# Patient Record
Sex: Female | Born: 1970
Health system: Southern US, Community
[De-identification: ages and names within clinical notes are randomized; demographics above are authoritative.]

## PROBLEM LIST (undated history)

## (undated) DIAGNOSIS — N189 Chronic kidney disease, unspecified: Secondary | ICD-10-CM

---

## 2004-01-13 ENCOUNTER — Emergency Department (HOSPITAL_COMMUNITY): Admission: EM | Admit: 2004-01-13 | Discharge: 2004-01-13 | Payer: Self-pay | Admitting: *Deleted

## 2004-02-12 ENCOUNTER — Emergency Department (HOSPITAL_COMMUNITY): Admission: EM | Admit: 2004-02-12 | Discharge: 2004-02-12 | Payer: Self-pay | Admitting: Emergency Medicine

## 2005-01-27 ENCOUNTER — Ambulatory Visit: Payer: Self-pay | Admitting: Family Medicine

## 2005-01-28 ENCOUNTER — Ambulatory Visit: Payer: Self-pay | Admitting: Family Medicine

## 2005-04-22 ENCOUNTER — Ambulatory Visit: Payer: Self-pay | Admitting: Family Medicine

## 2005-07-14 ENCOUNTER — Ambulatory Visit: Payer: Self-pay | Admitting: Family Medicine

## 2005-10-05 ENCOUNTER — Ambulatory Visit: Payer: Self-pay | Admitting: Family Medicine

## 2007-10-15 ENCOUNTER — Inpatient Hospital Stay (HOSPITAL_COMMUNITY): Admission: AD | Admit: 2007-10-15 | Discharge: 2007-10-15 | Payer: Self-pay | Admitting: Obstetrics & Gynecology

## 2007-10-23 ENCOUNTER — Inpatient Hospital Stay (HOSPITAL_COMMUNITY): Admission: AD | Admit: 2007-10-23 | Discharge: 2007-10-23 | Payer: Self-pay | Admitting: Obstetrics & Gynecology

## 2008-06-09 ENCOUNTER — Ambulatory Visit: Payer: Self-pay | Admitting: Gynecology

## 2008-06-09 ENCOUNTER — Inpatient Hospital Stay (HOSPITAL_COMMUNITY): Admission: AD | Admit: 2008-06-09 | Discharge: 2008-06-09 | Payer: Self-pay | Admitting: Gynecology

## 2008-06-09 ENCOUNTER — Inpatient Hospital Stay (HOSPITAL_COMMUNITY): Admission: AD | Admit: 2008-06-09 | Discharge: 2008-06-09 | Payer: Self-pay | Admitting: Family Medicine

## 2008-06-12 ENCOUNTER — Inpatient Hospital Stay (HOSPITAL_COMMUNITY): Admission: AD | Admit: 2008-06-12 | Discharge: 2008-06-12 | Payer: Self-pay | Admitting: Family Medicine

## 2008-09-02 ENCOUNTER — Inpatient Hospital Stay (HOSPITAL_COMMUNITY): Admission: AD | Admit: 2008-09-02 | Discharge: 2008-09-02 | Payer: Self-pay | Admitting: Obstetrics & Gynecology

## 2008-11-19 ENCOUNTER — Inpatient Hospital Stay (HOSPITAL_COMMUNITY): Admission: AD | Admit: 2008-11-19 | Discharge: 2008-11-19 | Payer: Self-pay | Admitting: Obstetrics & Gynecology

## 2009-03-18 ENCOUNTER — Ambulatory Visit: Payer: Self-pay | Admitting: Physician Assistant

## 2009-03-18 ENCOUNTER — Inpatient Hospital Stay (HOSPITAL_COMMUNITY): Admission: AD | Admit: 2009-03-18 | Discharge: 2009-03-18 | Payer: Self-pay | Admitting: Obstetrics & Gynecology

## 2010-01-29 ENCOUNTER — Emergency Department (HOSPITAL_COMMUNITY): Admission: EM | Admit: 2010-01-29 | Discharge: 2010-01-29 | Payer: Self-pay | Admitting: Emergency Medicine

## 2011-02-23 LAB — GC/CHLAMYDIA PROBE AMP, GENITAL
Chlamydia, DNA Probe: NEGATIVE
GC Probe Amp, Genital: NEGATIVE

## 2011-02-23 LAB — URINE MICROSCOPIC-ADD ON

## 2011-02-23 LAB — URINALYSIS, ROUTINE W REFLEX MICROSCOPIC
Bilirubin Urine: NEGATIVE
Ketones, ur: NEGATIVE mg/dL
Protein, ur: NEGATIVE mg/dL
pH: 6 (ref 5.0–8.0)

## 2011-02-23 LAB — CBC
MCHC: 34.5 g/dL (ref 30.0–36.0)
MCV: 90.9 fL (ref 78.0–100.0)
Platelets: 261 10*3/uL (ref 150–400)
RBC: 4.58 MIL/uL (ref 3.87–5.11)
RDW: 13.1 % (ref 11.5–15.5)

## 2011-02-23 LAB — HCG, QUANTITATIVE, PREGNANCY: hCG, Beta Chain, Quant, S: 29570 m[IU]/mL — ABNORMAL HIGH (ref <5)

## 2011-03-01 LAB — GC/CHLAMYDIA PROBE AMP, GENITAL: GC Probe Amp, Genital: NEGATIVE

## 2011-03-01 LAB — URINE CULTURE: Colony Count: >=100000

## 2011-03-01 LAB — URINALYSIS, ROUTINE W REFLEX MICROSCOPIC
Bilirubin Urine: NEGATIVE
Protein, ur: NEGATIVE mg/dL
Urobilinogen, UA: 0.2 mg/dL (ref 0.0–1.0)

## 2011-03-01 LAB — URINE MICROSCOPIC-ADD ON

## 2011-03-01 LAB — WET PREP, GENITAL: Yeast Wet Prep HPF POC: NONE SEEN

## 2011-03-01 LAB — ABO/RH: ABO/RH(D): A POS

## 2011-08-13 LAB — URINALYSIS, ROUTINE W REFLEX MICROSCOPIC
Bilirubin Urine: NEGATIVE
Glucose, UA: NEGATIVE
Glucose, UA: NEGATIVE
Ketones, ur: NEGATIVE
Nitrite: NEGATIVE
Nitrite: NEGATIVE
Protein, ur: NEGATIVE
Specific Gravity, Urine: <1.005 — ABNORMAL LOW
Urobilinogen, UA: 1
Urobilinogen, UA: 0.2

## 2011-08-13 LAB — CBC
Hemoglobin: 13.6
Hemoglobin: 13.6
MCHC: 34.1
MCV: 88.7
RBC: 4.48
RBC: 4.51
RDW: 12
WBC: 8.1

## 2011-08-13 LAB — URINE MICROSCOPIC-ADD ON: RBC / HPF: NONE SEEN

## 2011-08-13 LAB — DIFFERENTIAL
Lymphs Abs: 1.6
Monocytes Absolute: 0.9
Monocytes Relative: 11
Neutro Abs: 5.6
Neutrophils Relative %: 69

## 2011-08-13 LAB — WET PREP, GENITAL
Clue Cells Wet Prep HPF POC: NONE SEEN
Trich, Wet Prep: NONE SEEN

## 2011-08-13 LAB — URINE CULTURE

## 2011-08-13 LAB — GC/CHLAMYDIA PROBE AMP, GENITAL: GC Probe Amp, Genital: NEGATIVE

## 2011-08-13 LAB — POCT PREGNANCY, URINE: Preg Test, Ur: NEGATIVE

## 2011-08-17 LAB — GC/CHLAMYDIA PROBE AMP, GENITAL
Chlamydia, DNA Probe: POSITIVE — AB
GC Probe Amp, Genital: NEGATIVE

## 2011-08-17 LAB — WET PREP, GENITAL: Yeast Wet Prep HPF POC: NONE SEEN

## 2011-08-17 LAB — URINALYSIS, ROUTINE W REFLEX MICROSCOPIC
Bilirubin Urine: NEGATIVE
Glucose, UA: NEGATIVE
Protein, ur: NEGATIVE

## 2011-08-17 LAB — URINE CULTURE

## 2011-08-17 LAB — URINE MICROSCOPIC-ADD ON

## 2011-08-17 LAB — POCT PREGNANCY, URINE: Preg Test, Ur: NEGATIVE

## 2011-08-24 LAB — URINALYSIS, ROUTINE W REFLEX MICROSCOPIC
Protein, ur: 30 — AB
Urobilinogen, UA: 0.2

## 2011-08-24 LAB — CBC
MCHC: 35.2
RBC: 4.44
RDW: 12.3

## 2011-08-24 LAB — HCG, QUANTITATIVE, PREGNANCY: hCG, Beta Chain, Quant, S: 8 — ABNORMAL HIGH

## 2011-08-24 LAB — HCG, SERUM, QUALITATIVE: Preg, Serum: NEGATIVE

## 2012-02-28 ENCOUNTER — Inpatient Hospital Stay (HOSPITAL_COMMUNITY)
Admission: AD | Admit: 2012-02-28 | Discharge: 2012-02-28 | Disposition: A | Discharge status: Hospice | Payer: Self-pay | Attending: Obstetrics & Gynecology | Admitting: Obstetrics & Gynecology

## 2012-02-28 ENCOUNTER — Inpatient Hospital Stay (HOSPITAL_COMMUNITY): Payer: Self-pay

## 2012-02-28 ENCOUNTER — Encounter (HOSPITAL_COMMUNITY): Payer: Self-pay | Admitting: *Deleted

## 2012-02-28 DIAGNOSIS — R1032 Left lower quadrant pain: Secondary | ICD-10-CM | POA: Insufficient documentation

## 2012-02-28 DIAGNOSIS — N390 Urinary tract infection, site not specified: Secondary | ICD-10-CM | POA: Insufficient documentation

## 2012-02-28 DIAGNOSIS — B349 Viral infection, unspecified: Secondary | ICD-10-CM

## 2012-02-28 DIAGNOSIS — B9789 Other viral agents as the cause of diseases classified elsewhere: Secondary | ICD-10-CM | POA: Insufficient documentation

## 2012-02-28 HISTORY — DX: Chronic kidney disease, unspecified: N18.9

## 2012-02-28 LAB — URINALYSIS, ROUTINE W REFLEX MICROSCOPIC
Glucose, UA: NEGATIVE mg/dL
pH: 5.5 (ref 5.0–8.0)

## 2012-02-28 LAB — URINE MICROSCOPIC-ADD ON

## 2012-02-28 LAB — CBC
HCT: 40.3 % (ref 36.0–46.0)
Hemoglobin: 13.8 g/dL (ref 12.0–15.0)
RDW: 12.6 % (ref 11.5–15.5)
WBC: 14.3 10*3/uL — ABNORMAL HIGH (ref 4.0–10.5)

## 2012-02-28 LAB — WET PREP, GENITAL
Clue Cells Wet Prep HPF POC: NONE SEEN
Trich, Wet Prep: NONE SEEN

## 2012-02-28 MED ORDER — CEPHALEXIN 500 MG PO CAPS: 500.0000 mg | ORAL_CAPSULE | Freq: Four times a day (QID) | ORAL | Status: AC

## 2012-02-28 MED ORDER — ACETAMINOPHEN 500 MG PO TABS
1000.0000 mg | ORAL_TABLET | Freq: Once | ORAL | Status: AC
  Administered 2012-02-28: 1000 mg via ORAL
  Filled 2012-02-28: qty 2

## 2012-02-28 NOTE — MAU Note (Signed)
Pt c/o pain and burning from knees to feet for the past 7 months.  Was given pain meds for a few days, 7 months ago that helped with the pain.  Friday started with a fever and nausea.

## 2012-02-28 NOTE — MAU Provider Note (Signed)
History   Pt presents today c/o fever and chills since yesterday. She also c/o some mild dysuria. She has also c/o LLQ pain for the past 3 months that is worse with intercourse. She denies vag dc, bleeding, vag irritation. Her last episode of intercourse was about 2 months ago. She also c/o bilateral leg pain that starts below her knees and goes into her feet. She was given naproxen for the leg pain 7 months ago but the pain continues to come and go.   CSN: [id number]  Arrival date and time: 02/28/12 0021   First Provider Initiated Contact with Patient 02/28/12 0053      No chief complaint on file.  HPI  OB History    Grav Para Term Preterm Abortions TAB SAB Ect Mult Living   7 6 6  1  1   5       Past Medical History  Diagnosis Date  . Chronic kidney disease     History reviewed. No pertinent past surgical history.  History reviewed. No pertinent family history.  History  Substance Use Topics  . Smoking status: Not on file  . Smokeless tobacco: Not on file  . Alcohol Use: No    Allergies: Allergies not on file  No prescriptions prior to admission    Review of Systems  Constitutional: Positive for fever and chills. Negative for weight loss, malaise/fatigue and diaphoresis.  Eyes: Negative for blurred vision and double vision.  Respiratory: Negative for cough, hemoptysis, sputum production, shortness of breath and wheezing.   Cardiovascular: Negative for chest pain and palpitations.  Gastrointestinal: Positive for abdominal pain. Negative for nausea, vomiting, diarrhea and constipation.  Genitourinary: Positive for dysuria. Negative for urgency, frequency and hematuria.  Neurological: Negative for dizziness, weakness and headaches.  Psychiatric/Behavioral: Negative for depression and suicidal ideas.   Physical Exam   Blood pressure 95/63, pulse 148, temperature 102.9 F (39.4 C), temperature source Oral, resp. rate 20, weight 170 lb (77.111 kg), SpO2  96.00%.  Physical Exam  Nursing note and vitals reviewed. Constitutional: She is oriented to person, place, and time. She appears well-developed and well-nourished. No distress.  HENT:  Head: Normocephalic and atraumatic.  Eyes: EOM are normal. Pupils are equal, round, and reactive to light.  Cardiovascular: Regular rhythm and normal heart sounds.  Tachycardia present.  Exam reveals no gallop and no friction rub.   No murmur heard. Respiratory: Effort normal and breath sounds normal. No respiratory distress. She has no wheezes. She has no rales. She exhibits no tenderness.  GI: Soft. She exhibits no distension and no mass. There is tenderness (Mild tenderness to LLQ with palpation.). There is no rebound and no guarding.  Genitourinary: No bleeding around the vagina. Vaginal discharge found.       Copious amount of white, creamy vag dc. Cervix Lg/closed. Uterus NL size and shape. Tenderness to left adnexa. No obvious adnexal masses.  Neurological: She is alert and oriented to person, place, and time.  Skin: Skin is warm and dry. She is not diaphoretic.  Psychiatric: She has a normal mood and affect. Her behavior is normal. Judgment and thought content normal.    MAU Course  Procedures  Wet prep and GC/Chlamydia cultures done.  Results for orders placed during the hospital encounter of 02/28/12 (from the past 24 hour(s))  URINALYSIS, ROUTINE W REFLEX MICROSCOPIC     Status: Abnormal   Collection Time   02/28/12  1:00 AM      Component Value Range  Color, Urine YELLOW  YELLOW    APPearance CLEAR  CLEAR    Specific Gravity, Urine 1.020  1.005 - 1.030    pH 5.5  5.0 - 8.0    Glucose, UA NEGATIVE  NEGATIVE (mg/dL)   Hgb urine dipstick SMALL (*) NEGATIVE    Bilirubin Urine NEGATIVE  NEGATIVE    Ketones, ur NEGATIVE  NEGATIVE (mg/dL)   Protein, ur 30 (*) NEGATIVE (mg/dL)   Urobilinogen, UA 1.0  0.0 - 1.0 (mg/dL)   Nitrite POSITIVE (*) NEGATIVE    Leukocytes, UA MODERATE (*) NEGATIVE    WET PREP, GENITAL     Status: Abnormal   Collection Time   02/28/12  1:00 AM      Component Value Range   Yeast Wet Prep HPF POC NONE SEEN  NONE SEEN    Trich, Wet Prep NONE SEEN  NONE SEEN    Clue Cells Wet Prep HPF POC NONE SEEN  NONE SEEN    WBC, Wet Prep HPF POC MODERATE (*) NONE SEEN   URINE MICROSCOPIC-ADD ON     Status: Abnormal   Collection Time   02/28/12  1:00 AM      Component Value Range   Squamous Epithelial / LPF FEW (*) RARE    WBC, UA 21-50  <3 (WBC/hpf)   RBC / HPF 0-2  <3 (RBC/hpf)   Bacteria, UA MANY (*) RARE    Urine-Other MUCOUS PRESENT    CBC     Status: Abnormal   Collection Time   02/28/12  1:15 AM      Component Value Range   WBC 14.3 (*) 4.0 - 10.5 (K/uL)   RBC 4.67  3.87 - 5.11 (MIL/uL)   Hemoglobin 13.8  12.0 - 15.0 (g/dL)   HCT 04.5  40.9 - 81.1 (%)   MCV 86.3  78.0 - 100.0 (fL)   MCH 29.6  26.0 - 34.0 (pg)   MCHC 34.2  30.0 - 36.0 (g/dL)   RDW 91.4  78.2 - 95.6 (%)   Platelets 202  150 - 400 (K/uL)   Urine sent for culture.  Pt temp and pulse now WNL following po tylenol.  Assessment and Plan  UTI: discussed with pt at length via interpreter. Will tx with Keflex and await urine culture.  Viral syndrome: pt will use supportive care with tylenol for pain and fever. She will f/u with her PCP. Discussed diet, activity, risks, and precautions.  Clinton Gallant. Lovella Hardie III, DrHSc, MPAS, PA-C  02/28/2012, 1:05 AM

## 2012-02-29 LAB — GC/CHLAMYDIA PROBE AMP, GENITAL: Chlamydia, DNA Probe: NEGATIVE

## 2012-03-10 NOTE — MAU Provider Note (Signed)
Medical Screening exam and patient care preformed by advanced practice provider.  Agree with the above management.  

## 2014-04-03 ENCOUNTER — Ambulatory Visit: Payer: Self-pay | Attending: Internal Medicine

## 2014-09-16 ENCOUNTER — Encounter (HOSPITAL_COMMUNITY): Payer: Self-pay | Admitting: *Deleted

## 2018-05-16 ENCOUNTER — Ambulatory Visit: Payer: Self-pay | Attending: Internal Medicine | Admitting: Internal Medicine

## 2018-05-16 ENCOUNTER — Encounter: Payer: Self-pay | Admitting: Internal Medicine

## 2018-05-16 VITALS — BP 110/80 | HR 83 | Temp 98.0°F | Ht 62.0 in | Wt 173.0 lb

## 2018-05-16 DIAGNOSIS — R110 Nausea: Secondary | ICD-10-CM | POA: Insufficient documentation

## 2018-05-16 DIAGNOSIS — R079 Chest pain, unspecified: Secondary | ICD-10-CM | POA: Insufficient documentation

## 2018-05-16 DIAGNOSIS — K219 Gastro-esophageal reflux disease without esophagitis: Secondary | ICD-10-CM | POA: Insufficient documentation

## 2018-05-16 DIAGNOSIS — R0602 Shortness of breath: Secondary | ICD-10-CM | POA: Insufficient documentation

## 2018-05-16 DIAGNOSIS — M170 Bilateral primary osteoarthritis of knee: Secondary | ICD-10-CM | POA: Insufficient documentation

## 2018-05-16 MED ORDER — MELOXICAM 7.5 MG PO TABS: 15.0000 mg | ORAL_TABLET | Freq: Every day | ORAL | 2 refills | Status: DC

## 2018-05-16 MED ORDER — OMEPRAZOLE 20 MG PO CPDR: 20.0000 mg | DELAYED_RELEASE_CAPSULE | Freq: Every day | ORAL | 3 refills | Status: DC

## 2018-05-16 MED FILL — OMEPRAZOLE 20 MG CAP: 20 | 30 days supply | Qty: 30 | Fill #0

## 2018-05-16 MED FILL — MELOXICAM 7.5 MG TABLET: 7.5 | 15 days supply | Qty: 30 | Fill #0

## 2018-05-16 NOTE — Patient Instructions (Signed)
Enfermedad por reflujo gastroesofgico en los adultos (Gastroesophageal Reflux Disease, Adult) Normalmente, los alimentos descienden por el esfago y se depositan en el estmago para su digestin. Si una persona tiene enfermedad por reflujo gastroesofgico (ERGE), los alimentos y el cido estomacal regresan al esfago. Cuando esto ocurre, el esfago se irrita y se hincha (inflama). Con el tiempo, la ERGE puede provocar la formacin de pequeas perforaciones (lceras) en la mucosa del esfago. CUIDADOS EN EL HOGAR Dieta  Siga la dieta como se lo haya indicado el mdico. Tal vez deba evitar los siguientes alimentos y bebidas: ? Caf y t (con o sin cafena). ? Bebidas que contengan alcohol. ? Bebidas energizantes y deportivas. ? Gaseosas o refrescos. ? Chocolate y cacao. ? Menta y esencias de menta. ? Ajo y cebollas. ? Rbano picante. ? Alimentos muy condimentados y cidos, como pimientos, chile en polvo, curry en polvo, vinagre, salsas picantes y salsa barbacoa. ? Frutas ctricas y sus jugos, como naranjas, limones y limas. ? Alimentos a base de tomates, como salsa roja, chile, salsa y pizza con salsa roja. ? Alimentos fritos y grasos, como rosquillas, papas fritas y aderezos con alto contenido de grasa. ? Carnes con alto contenido de grasa, como hot dogs, filetes de entrecot, salchicha, jamn y tocino. ? Productos lcteos con alto contenido de grasa, como leche entera, mantequilla y queso crema.  Consuma pequeas porciones de comida con ms frecuencia. Evite consumir porciones abundantes.  Evite beber mucho lquido con las comidas.  No coma durante las 2 o 3horas previas a la hora de acostarse.  No se acueste inmediatamente despus de comer.  No haga actividad fsica enseguida despus de comer. Instrucciones generales  Est atento a cualquier cambio en los sntomas.  Tome los medicamentos de venta libre y los recetados solamente como se lo haya indicado el mdico. No tome  aspirina, ibuprofeno ni otros antiinflamatorios no esteroides (AINE), a menos que el mdico lo autorice.  No consuma ningn producto que contenga tabaco, lo que incluye cigarrillos, tabaco de mascar y cigarrillos electrnicos. Si necesita ayuda para dejar de fumar, consulte al mdico.  Use ropa suelta. No use nada ajustado alrededor de la cintura.  Levante (eleve) unas 6pulgadas (15centmetros) la cabecera de la cama.  Intente bajar el nivel de estrs. Si necesita ayuda para hacerlo, consulte al mdico.  Si tiene sobrepeso, adelgace hasta alcanzar un peso saludable. Pregntele a su mdico cmo puede perder peso de manera segura.  Concurra a todas las visitas de control como se lo haya indicado el mdico. Esto es importante. SOLICITE AYUDA SI:  Aparecen nuevos sntomas.  Baja de peso y no sabe por qu.  Tiene dificultad para tragar o siente dolor al hacerlo.  Tiene sibilancias o tos que no desaparece.  Los sntomas no mejoran con el tratamiento.  Tiene la voz ronca. SOLICITE AYUDA DE INMEDIATO SI:  Tiene dolor en los brazos, el cuello, los maxilares, la dentadura o la espalda.  Transpira, se marea o tiene sensacin de desvanecimiento.  Siente falta de aire o dolor en el pecho.  Vomita y el vmito es parecido a la sangre o a los granos de caf.  Pierde el conocimiento (se desmaya).  Las heces son sanguinolentas o de color negro.  No puede tragar, beber o comer. Esta informacin no tiene como fin reemplazar el consejo del mdico. Asegrese de hacerle al mdico cualquier pregunta que tenga. Document Released: 12/04/2010 Document Revised: 07/23/2015 Document Reviewed: 02/26/2015 Elsevier Interactive Patient Education  2018 Elsevier Inc.  

## 2018-05-16 NOTE — Progress Notes (Signed)
Patient ID: Angela Khan, female    DOB: 1971-08-11  MRN: [id number]  CC: Chest Pain and Shortness of Breath   Subjective: Angela Khan is a 47 y.o. female who presents for new pt visit.  Interpreter from [employer], Stark Kleinony Bastias, is with her. Her concerns today include:   Pt c/o intermittent episodes of difficulty breathing when she eats and nausea after meal x 8 mths.  Describes as hurting in lower chest and  epigastric area.  Feels better when she lays down.  Denies any globus sensation or feeling of food getting stuck in the chest.  She is not had any weight changes.  Symptoms occur with any and all foods.  C/o intermittent pain BL knees x 4 yrs Worse when climbing stairs Better with walking on level ground No stiffness; some swelling T[place] pain med from [place] as needed     Current Outpatient Medications on File Prior to Visit  Medication Sig Dispense Refill  . OVER THE COUNTER MEDICATION Take 1 tablet by mouth as needed. Over the counter medicine called XL-3 which is a cough/cold medication     No current facility-administered medications on file prior to visit.     No Known Allergies  Social History   Socioeconomic History  . Marital status: Single    Spouse name: Not on file  . Number of children: Not on file  . Years of education: Not on file  . Highest education level: Not on file  Occupational History  . Not on file  Social Needs  . Financial resource strain: Not on file  . Food insecurity:    Worry: Not on file    Inability: Not on file  . Transportation needs:    Medical: Not on file    Non-medical: Not on file  Tobacco Use  . Smoking status: Not on file  Substance and Sexual Activity  . Alcohol use: No  . Drug use: Not on file  . Sexual activity: Yes    Birth control/protection: Injection    Comment: depo provera, last injection Mar 22nd 2013  Lifestyle  . Physical activity:    Days per week: Not on file    Minutes per session: Not  on file  . Stress: Not on file  Relationships  . Social connections:    Talks on phone: Not on file    Gets together: Not on file    Attends religious service: Not on file    Active member of club or organization: Not on file    Attends meetings of clubs or organizations: Not on file    Relationship status: Not on file  . Intimate partner violence:    Fear of current or ex partner: Not on file    Emotionally abused: Not on file    Physically abused: Not on file    Forced sexual activity: Not on file  Other Topics Concern  . Not on file  Social History Narrative  . Not on file    No family history on file.  No past surgical history on file.  ROS: Review of Systems Negative except as stated above PHYSICAL EXAM: BP 110/80   Pulse 83   Temp 98 F (36.7 C) (Oral)   Ht 5\' 2"  (1.575 m)   Wt 173 lb (78.5 kg)   SpO2 98%   BMI 31.64 kg/m   Physical Exam  General appearance - alert, well appearing, and in no distress Mental status - normal mood, behavior,  speech, dress, motor activity, and thought processes Neck - supple, no significant adenopathy Chest - clear to auscultation, no wheezes, rales or rhonchi, symmetric air entry Heart - normal rate, regular rhythm, normal S1, S2, no murmurs, rubs, clicks or gallops Abdomen - soft, nontender, nondistended, no masses or organomegaly Musculoskeletal -knees: Mild enlargement of both joints.  Right knee: Mild tenderness on palpation along the medial and lateral joint lines.  No edema.  Moderate crepitus laterally with passive range of movement.  Left knee: No point tenderness.  Mild crepitus with passive range of motion Extremities - peripheral pulses normal, no pedal edema, no clubbing or cyanosis ASSESSMENT AND PLAN: 1. Gastroesophageal reflux disease without esophagitis I think her symptoms are likely due to GERD.  She is agreeable for trial of omeprazole.  GERD precautions discussed. - omeprazole (PRILOSEC) 20 MG capsule; Take 1  capsule (20 mg total) by mouth daily.  Dispense: 30 capsule; Refill: 3  2. Primary osteoarthritis of both knees Encourage regular exercise like brisk walking 3 to 4 days a week. - meloxicam (MOBIC) 7.5 MG tablet; Take 2 tablets (15 mg total) by mouth daily.  Dispense: 30 tablet; Refill: 2   Patient was given the opportunity to ask questions.  Patient verbalized understanding of the plan and was able to repeat key elements of the plan.   No orders of the defined types were placed in this encounter.    Requested Prescriptions   Signed Prescriptions Disp Refills  . meloxicam (MOBIC) 7.5 MG tablet 30 tablet 2    Sig: Take 2 tablets (15 mg total) by mouth daily.  Marland Kitchen omeprazole (PRILOSEC) 20 MG capsule 30 capsule 3    Sig: Take 1 capsule (20 mg total) by mouth daily.    Return in about 7 weeks (around 07/04/2018).  Jonah Blue, MD, FACP

## 2018-05-26 ENCOUNTER — Ambulatory Visit: Payer: Self-pay | Attending: Internal Medicine

## 2018-07-07 ENCOUNTER — Ambulatory Visit: Payer: Self-pay | Admitting: Internal Medicine

## 2018-09-08 MED FILL — OMEPRAZOLE 20 MG CAP: 20 | 30 days supply | Qty: 30 | Fill #1

## 2018-09-08 MED FILL — MELOXICAM 7.5 MG TABLET: 7.5 | 15 days supply | Qty: 30 | Fill #1

## 2019-01-20 DIAGNOSIS — R05 Cough: Secondary | ICD-10-CM | POA: Insufficient documentation

## 2019-01-20 DIAGNOSIS — Z209 Contact with and (suspected) exposure to unspecified communicable disease: Secondary | ICD-10-CM | POA: Insufficient documentation

## 2019-01-20 DIAGNOSIS — N189 Chronic kidney disease, unspecified: Secondary | ICD-10-CM | POA: Insufficient documentation

## 2019-01-20 DIAGNOSIS — J029 Acute pharyngitis, unspecified: Secondary | ICD-10-CM | POA: Insufficient documentation

## 2019-01-20 DIAGNOSIS — Z79899 Other long term (current) drug therapy: Secondary | ICD-10-CM | POA: Insufficient documentation

## 2019-01-20 DIAGNOSIS — J3489 Other specified disorders of nose and nasal sinuses: Secondary | ICD-10-CM | POA: Insufficient documentation

## 2019-01-21 ENCOUNTER — Encounter (HOSPITAL_COMMUNITY): Payer: Self-pay

## 2019-01-21 ENCOUNTER — Emergency Department (HOSPITAL_COMMUNITY)
Admission: EM | Admit: 2019-01-21 | Discharge: 2019-01-21 | Disposition: A | Discharge status: Home / Self Care | Payer: Self-pay | Attending: Emergency Medicine | Admitting: Emergency Medicine

## 2019-01-21 DIAGNOSIS — J029 Acute pharyngitis, unspecified: Secondary | ICD-10-CM

## 2019-01-21 DIAGNOSIS — R059 Cough, unspecified: Secondary | ICD-10-CM

## 2019-01-21 DIAGNOSIS — R05 Cough: Secondary | ICD-10-CM

## 2019-01-21 LAB — GROUP A STREP BY PCR: GROUP A STREP BY PCR: NOT DETECTED

## 2019-01-21 MED ORDER — LIDOCAINE VISCOUS HCL 2 % MT SOLN: 15.0000 mL | Freq: Four times a day (QID) | OROMUCOSAL | 0 refills | Status: DC | PRN

## 2019-01-21 MED ORDER — SUCRALFATE 1 G PO TABS: 1.0000 g | ORAL_TABLET | Freq: Four times a day (QID) | ORAL | 0 refills | Status: DC | PRN

## 2019-01-21 NOTE — Discharge Instructions (Signed)
It was my pleasure taking care of you today!   Lidocaine solution and carafate as needed for sore throat.   Call the primary care doctor listed to schedule a follow up appointment. Call them in the morning.   Return to ER for difficulty breathing, if you cannot swallow, you develop a fever, new or worsening symptoms occur or you have any additional concerns.

## 2019-01-21 NOTE — ED Notes (Signed)
Called for room, no answer X2 

## 2019-01-21 NOTE — ED Triage Notes (Signed)
Pt states that she has had a sore throat for several days.

## 2019-01-21 NOTE — ED Provider Notes (Signed)
[facility] EMERGENCY DEPARTMENT Provider Note   CSN: [id number] Arrival date & time: 01/20/19  2356    History   Chief Complaint Chief Complaint  Patient presents with  . Sore Throat    HPI Angela Khan is a 48 y.o. female.     The history is provided by the patient and medical records. A language interpreter was used Multimedia programmer - Spanish).  Sore Throat  Pertinent negatives include no chest pain, no abdominal pain and no headaches.   Angela Khan is a 48 y.o. female  with a PMH of CKD who presents to the Emergency Department complaining of sore throat and pain with swallowing which began about 2 days ago.  Associated with a dry cough.  Her husband has been sick with flulike symptoms, but has not had a sore throat to her knowledge.  She tried ibuprofen which helped for a little while, but then the pain returned.  She denies any fever or chills.  No chest pain or trouble breathing.  She describes her pain as a burning sensation.   Past Medical History:  Diagnosis Date  . Chronic kidney disease     There are no active problems to display for this patient.   History reviewed. No pertinent surgical history.   OB History    Gravida  7   Para  6   Term  6   Preterm      AB  1   Living  5     SAB  1   TAB      Ectopic      Multiple      Live Births               Home Medications    Prior to Admission medications   Medication Sig Start Date End Date Taking? Authorizing Provider  lidocaine (XYLOCAINE) 2 % solution Use as directed 15 mLs in the mouth or throat every 6 (six) hours as needed (sore throat). 01/21/19   Ward, Chase Picket, PA-C  meloxicam (MOBIC) 7.5 MG tablet Take 2 tablets (15 mg total) by mouth daily. 05/16/18   Marcine Matar, MD  omeprazole (PRILOSEC) 20 MG capsule Take 1 capsule (20 mg total) by mouth daily. 05/16/18   Marcine Matar, MD  OVER THE COUNTER MEDICATION Take 1 tablet by mouth as needed. Over  the counter medicine called XL-3 which is a cough/cold medication    [provider]  sucralfate (CARAFATE) 1 g tablet Take 1 tablet (1 g total) by mouth 4 (four) times daily as needed. 01/21/19   Ward, Chase Picket, PA-C    Family History No family history on file.  Social History Social History   Tobacco Use  . Smoking status: Never Smoker  . Smokeless tobacco: Never Used  Substance Use Topics  . Alcohol use: No  . Drug use: Not on file     Allergies   Patient has no known allergies.   Review of Systems Review of Systems  Constitutional: Negative for chills and fever.  HENT: Positive for congestion and sore throat.   Respiratory: Positive for cough.   Cardiovascular: Negative for chest pain.  Gastrointestinal: Negative for abdominal pain, nausea and vomiting.  Musculoskeletal: Negative for neck pain.  Skin: Negative for rash.  Neurological: Negative for headaches.     Physical Exam Updated Vital Signs BP 115/76 (BP Location: Right Arm)   Pulse 86   Temp 97.9 F (36.6 C) (Oral)  Resp 20   SpO2 97%   Physical Exam Vitals signs and nursing note reviewed.  Constitutional:      General: She is not in acute distress.    Appearance: She is well-developed.  HENT:     Head: Normocephalic and atraumatic.     Mouth/Throat:     Comments: + PND. OP with erythema, no tonsillar hypertrophy or exudates. Neck:     Musculoskeletal: Neck supple.  Cardiovascular:     Rate and Rhythm: Normal rate and regular rhythm.     Heart sounds: Normal heart sounds. No murmur.  Pulmonary:     Effort: Pulmonary effort is normal. No respiratory distress.     Breath sounds: Normal breath sounds. No wheezing or rales.     Comments: Lungs are clear to auscultation bilaterally. Abdominal:     Comments: No abdominal tenderness.  Musculoskeletal: Normal range of motion.  Skin:    General: Skin is warm and dry.  Neurological:     Mental Status: She is alert.      ED  Treatments / Results  Labs (all labs ordered are listed, but only abnormal results are displayed) Labs Reviewed  GROUP A STREP BY PCR    EKG None  Radiology No results found.  Procedures Procedures (including critical care time)  Medications Ordered in ED Medications - No data to display   Initial Impression / Assessment and Plan / ED Course  I have reviewed the triage vital signs and the nursing notes.  Pertinent labs & imaging results that were available during my care of the patient were reviewed by me and considered in my medical decision making (see chart for details).       [name] is a 48 y.o. female who presents to ED for sore throat and dry cough..   On exam, patient is afebrile, non-toxic appearing with a clear lung exam. Mild rhinorrhea and OP with erythema but no exudates or tonsillar hypertrophy.  Strep test negative.  Sxs today likely due to viral URI.does have a history of GERD and describes the pain in her throat as a burning sensation.  Possible this could be related to her GERD as well.  Symptomatic home care instructions discussed. Rx for Carafate and viscous lidocaine given. PCP follow up strongly encouraged if symptoms persist. Reasons to return to ER discussed. All questions answered.   Blood pressure 115/76, pulse 86, temperature 97.9 F (36.6 C), temperature source Oral, resp. rate 20, SpO2 97 %.   Final Clinical Impressions(s) / ED Diagnoses   Final diagnoses:  Sore throat  Cough    ED Discharge Orders         Ordered    lidocaine (XYLOCAINE) 2 % solution  Every 6 hours PRN     01/21/19 0820    sucralfate (CARAFATE) 1 g tablet  4 times daily PRN     01/21/19 0820           Ward, Chase Picket, PA-C 01/21/19 2620    Shaune Pollack, MD 01/21/19 [phone]

## 2019-01-21 NOTE — ED Notes (Signed)
Patient verbalizes understanding of discharge instructions. Opportunity for questioning and answers were provided. Armband removed by staff, pt discharged from ED ambulatory.   

## 2019-01-21 NOTE — ED Notes (Signed)
ED Provider at bedside. 

## 2019-09-05 ENCOUNTER — Other Ambulatory Visit (HOSPITAL_COMMUNITY): Payer: Self-pay | Admitting: *Deleted

## 2019-09-05 DIAGNOSIS — Z1231 Encounter for screening mammogram for malignant neoplasm of breast: Secondary | ICD-10-CM

## 2019-11-20 ENCOUNTER — Ambulatory Visit
Admission: RE | Admit: 2019-11-20 | Discharge: 2019-11-20 | Disposition: A | Discharge status: Home / Self Care | Payer: No Typology Code available for payment source | Source: Ambulatory Visit | Attending: Obstetrics and Gynecology | Admitting: Obstetrics and Gynecology

## 2019-11-20 ENCOUNTER — Ambulatory Visit (HOSPITAL_COMMUNITY)
Admission: RE | Admit: 2019-11-20 | Discharge: 2019-11-20 | Disposition: A | Discharge status: Home / Self Care | Payer: Self-pay | Source: Ambulatory Visit | Attending: Obstetrics and Gynecology | Admitting: Obstetrics and Gynecology

## 2019-11-20 ENCOUNTER — Encounter (HOSPITAL_COMMUNITY): Payer: Self-pay

## 2019-11-20 ENCOUNTER — Other Ambulatory Visit: Payer: Self-pay

## 2019-11-20 DIAGNOSIS — Z1239 Encounter for other screening for malignant neoplasm of breast: Secondary | ICD-10-CM | POA: Insufficient documentation

## 2019-11-20 DIAGNOSIS — Z1231 Encounter for screening mammogram for malignant neoplasm of breast: Secondary | ICD-10-CM

## 2019-11-20 NOTE — Patient Instructions (Signed)
Explained breast self awareness with Angela Khan. Patient did not need a Pap smear today due to last Pap smear was in October 2020 per patient. Let her know [facility] will cover Pap smears every 3 years unless has a history of abnormal Pap smears. Referred patient to the [facility] for a screening mammogram. Appointment scheduled for Tuesday, November 20, 2019 at 1030. Patient aware of appointment and will be there. Let patient know the Breast Center will follow up with her within the next couple weeks with results of mammogram by letter or phone. Angela Khan verbalized understanding.  Wagner Tanzi, Kathaleen Maser, RN 9:38 AM

## 2019-11-20 NOTE — Progress Notes (Signed)
No complaints today.   Pap Smear: Pap smear not completed today. Last Pap smear was in October 2020 at the [facility] and normal per patient. Per patient has no history of an abnormal Pap smear. No Pap smear results are in Epic.  Physical exam: Breasts Breasts symmetrical. No skin abnormalities bilateral breasts. No nipple retraction bilateral breasts. No nipple discharge bilateral breasts. No lymphadenopathy. No lumps palpated bilateral breasts. No complaints of pain or tenderness on exam. Referred patient to the [facility] for a screening mammogram. Appointment scheduled for Tuesday, November 20, 2019 at 1030.        Pelvic/Bimanual No Pap smear completed today since last Pap smear was in October 2020 per patient. Pap smear not indicated per B[facility]CP guidelines.   Smoking History: Patient has never smoked.  Patient Navigation: Patient education provided. Access to services provided for patient through [facility] program. Spanish interpreter provided.   Breast and Cervical Cancer Risk Assessment: Patient has no family history of breast cancer, known genetic mutations, or radiation treatment to the chest before age 46. Patient has no history of cervical dysplasia, immunocompromised, or DES exposure in-utero.  Risk Assessment    Risk Scores      11/20/2019   Last edited by: Priscille Heidelberg, RN   5-year risk: 0.4 %   Lifetime risk: 4.4 %         Used Spanish interpreter Natale Lay from [employer].

## 2019-11-21 ENCOUNTER — Other Ambulatory Visit: Payer: Self-pay | Admitting: Obstetrics and Gynecology

## 2019-11-21 DIAGNOSIS — R928 Other abnormal and inconclusive findings on diagnostic imaging of breast: Secondary | ICD-10-CM

## 2019-11-28 ENCOUNTER — Other Ambulatory Visit: Payer: Self-pay

## 2019-11-28 ENCOUNTER — Ambulatory Visit
Admission: RE | Admit: 2019-11-28 | Discharge: 2019-11-28 | Disposition: A | Discharge status: Home / Self Care | Payer: No Typology Code available for payment source | Source: Ambulatory Visit | Attending: Obstetrics and Gynecology | Admitting: Obstetrics and Gynecology

## 2019-11-28 ENCOUNTER — Ambulatory Visit: Payer: No Typology Code available for payment source

## 2019-11-28 DIAGNOSIS — R928 Other abnormal and inconclusive findings on diagnostic imaging of breast: Secondary | ICD-10-CM

## 2019-12-10 ENCOUNTER — Ambulatory Visit: Payer: Self-pay

## 2019-12-17 ENCOUNTER — Inpatient Hospital Stay: Payer: Self-pay | Attending: Obstetrics and Gynecology | Admitting: *Deleted

## 2019-12-17 ENCOUNTER — Other Ambulatory Visit: Payer: Self-pay | Admitting: Obstetrics and Gynecology

## 2019-12-17 ENCOUNTER — Other Ambulatory Visit: Payer: Self-pay

## 2019-12-17 VITALS — BP 127/79 | Temp 98.0°F | Ht 61.75 in | Wt 169.0 lb

## 2019-12-17 DIAGNOSIS — Z0000 Encounter for general adult medical examination without abnormal findings: Secondary | ICD-10-CM

## 2019-12-17 NOTE — Progress Notes (Signed)
[facility] initial screening   interpreter- Natale Lay, [employer]   Clinical Measurement:  Height: 61.75 in Weight: 169 lb  Blood Pressure: 136/82  Blood Pressure #2: 127/79 Fasting Labs Drawn Today, will review with patient when they result.   Medical History:  Patient states that she does not have a history of high cholesterol, high blood pressure or diabetes.  Medications:  Patient states that she does not take medication to lower cholesterol, blood pressure or blood sugar. Patient does not take an aspirin a day to help prevent a heart attack or stroke.    Blood pressure, self measurement: Patient states that she does not measure blood pressure from home and has not been told to do so by a healthcare provider.   Nutrition: Patient states that on average she eats 1 cup of fruit and 0 cups of vegetables per day. Patient states that she does not eat fish at least 2 times per week. Patient eats less than half servings of whole grains. Patient does not drink less than 36 ounces of beverages with added sugar weekly. Patient is currently watching sodium or salt intake. In the past 7 days patient has not had any drinks containing alcohol. On average patient does not drink any drinks containing alcohol.      Physical activity:  Patient states that she gets 0 minutes of moderate and 0 minutes of vigorous physical activity each week.  Smoking status:  Patient states that she has never smoked tobacco.   Quality of life:  Over the past 2 weeks patient states that she has had several days where she has little interest or pleasure in doing things and several days where she has felt down, depressed or hopeless.    Risk reduction and counseling:    Health Coaching: Encouraged patient to add more fruits and vegetables into daily diet. Explained that the recommendation is for 2 cups of fruit and 3 cups of vegetables daily. Also encouraged her to add more heart healthy fish (salmon, tuna, mackerel or sardines)  and whole grains(brown rice, whole wheat bread or pasta, whole grain cereals or oatmeal) into diet. Encouraged patient to reduce the amount of agua de fruita that she consumes or as a healthier option eliminate the sugar that she adds to it and use fruit only for flavor. Also encouraged patient to try and start exercising for at least 20 minutes a day. Also gave patient the resource list for local counselors that she can call if she would like to speak with someone.   Navigation:  I will notify patient of lab results.  Patient is aware of 2 more health coaching sessions and a follow up.  Time: 25 minutes

## 2019-12-18 LAB — LIPID PANEL W/O CHOL/HDL RATIO
Cholesterol, Total: 174 mg/dL (ref 100–199)
HDL: 36 mg/dL — ABNORMAL LOW (ref >39)
LDL Chol Calc (NIH): 117 mg/dL — ABNORMAL HIGH (ref 0–99)
Triglycerides: 115 mg/dL (ref 0–149)
VLDL Cholesterol Cal: 21 mg/dL (ref 5–40)

## 2019-12-18 LAB — GLUCOSE, RANDOM: Glucose: 117 mg/dL — ABNORMAL HIGH (ref 65–99)

## 2019-12-18 LAB — HGB A1C W/O EAG: Hgb A1c MFr Bld: 5.9 % — ABNORMAL HIGH (ref 4.8–5.6)

## 2019-12-28 ENCOUNTER — Telehealth: Payer: Self-pay

## 2019-12-28 NOTE — Telephone Encounter (Signed)
Health coaching 2   interpreter- [employer], [phone]   Labs- 174 cholesterol , 117 LDL cholesterol, 115 triglycerides, 36 HDL cholesterol , 5.9 hemoglobin A1C , mean 117 plasma glucose. Patient understands and is aware of her lab results.   Goals- Spoke with patient about lab results and answered any questions that patient had regarding lab results. Goals- Decrease the amount of sweets/sugars and carbs consumed. Encouraged patient to try and stop adding sugar to her agua de fruita and use the fruit for sweetener. Decrease the amount of fried and fatty foods consumed. Encouraged patient to try grilling, baking, sauteeing or broiling foods instead. Increase the amount of heart healthy fish consumed like salmon, tuna, mackerel or sardines instead. Increase the amount of whole grains in diet (brown rice, oatmeal, whole grain cereals, whole wheat bread or whole grain pastas). Encouraged patient to try and start exercising for at least 20 minutes a day.   Navigation:  Patient is aware of 1 more health coaching sessions and a follow up. Patient is scheduled for follow-up with [facility] on February 22,2021 @ 3:00 pm.   Time- 20 minutes

## 2020-01-07 ENCOUNTER — Ambulatory Visit: Payer: No Typology Code available for payment source | Admitting: Internal Medicine

## 2020-10-22 ENCOUNTER — Telehealth: Payer: Self-pay

## 2020-10-22 ENCOUNTER — Ambulatory Visit: Payer: Self-pay | Admitting: Emergency Medicine

## 2020-10-22 NOTE — Telephone Encounter (Signed)
Attempted to call patient to do [facility] 3 for [facility] Woman. Patient's daughter answered the phone and stated that she was at work and works until 7 pm.

## 2020-10-23 ENCOUNTER — Encounter: Payer: Self-pay | Admitting: Emergency Medicine

## 2020-10-23 ENCOUNTER — Other Ambulatory Visit: Payer: Self-pay | Admitting: Obstetrics and Gynecology

## 2020-10-23 DIAGNOSIS — Z1231 Encounter for screening mammogram for malignant neoplasm of breast: Secondary | ICD-10-CM

## 2020-11-10 ENCOUNTER — Other Ambulatory Visit: Payer: Self-pay | Admitting: *Deleted

## 2020-11-10 DIAGNOSIS — Z1231 Encounter for screening mammogram for malignant neoplasm of breast: Secondary | ICD-10-CM

## 2020-12-12 ENCOUNTER — Ambulatory Visit: Payer: Self-pay

## 2020-12-16 ENCOUNTER — Encounter (INDEPENDENT_AMBULATORY_CARE_PROVIDER_SITE_OTHER): Payer: Self-pay

## 2020-12-16 ENCOUNTER — Ambulatory Visit: Payer: No Typology Code available for payment source | Admitting: *Deleted

## 2020-12-16 ENCOUNTER — Other Ambulatory Visit: Payer: Self-pay

## 2020-12-16 ENCOUNTER — Ambulatory Visit
Admission: RE | Admit: 2020-12-16 | Discharge: 2020-12-16 | Disposition: A | Discharge status: Home / Self Care | Payer: No Typology Code available for payment source | Source: Ambulatory Visit | Attending: Internal Medicine | Admitting: Internal Medicine

## 2020-12-16 VITALS — BP 130/80 | Wt 172.3 lb

## 2020-12-16 DIAGNOSIS — Z1239 Encounter for other screening for malignant neoplasm of breast: Secondary | ICD-10-CM

## 2020-12-16 DIAGNOSIS — Z1231 Encounter for screening mammogram for malignant neoplasm of breast: Secondary | ICD-10-CM

## 2020-12-16 NOTE — Progress Notes (Signed)
[name] is a 50 y.o. female who presents to [facility] clinic today with no complaints.    Pap Smear: Pap smear not completed today. Last Pap smear was in October 2020 at the [facility] clinic and was normal per patient. Per patient has no history of an abnormal Pap smear. Last Pap smear result is not available in Epic.   Physical exam: Breasts Breasts symmetrical. No skin abnormalities bilateral breasts. No nipple retraction bilateral breasts. No nipple discharge bilateral breasts. No lymphadenopathy. No lumps palpated bilateral breasts. No complaints of pain or tenderness on exam.       Pelvic/Bimanual Pap is not indicated today per BCCCP guidelines.   Smoking History: Patient has never smoked.   Patient Navigation: Patient education provided. Access to services provided for patient through [facility] program. Spanish interpreter Natale Lay from [facility] provided.   Breast and Cervical Cancer Risk Assessment: Patient does not have family history of breast cancer, known genetic mutations, or radiation treatment to the chest before age 34. Patient does not have history of cervical dysplasia, immunocompromised, or DES exposure in-utero.  Risk Assessment    Risk Scores      12/16/2020 11/20/2019   Last edited by: Narda Rutherford, LPN Cerrone Debold, Carlye Grippe, RN   5-year risk: 0.4 % 0.4 %   Lifetime risk: 4.4 % 4.4 %         A: BCCCP exam without pap smear No complaints.  P: Referred patient to the [facility] for a screening mammogram on the mobile unit. Appointment scheduled Tuesday, December 16, 2020 at 1020.  Priscille Heidelberg, RN 12/16/2020 9:29 AM

## 2020-12-16 NOTE — Patient Instructions (Signed)
Explained breast self awareness with Angela Khan. Patient did not need a Pap smear today due to last Pap smear was in October 2020 per patient. Let her know BCCCP will cover Pap smears every 3 years unless has a history of abnormal Pap smears. Referred patient to the [facility] for a screening mammogram on the mobile unit. Appointment scheduled Tuesday, December 16, 2020 at 1020. Patient escorted to the mobile unit following BCCCP appointment for her screening mammogram. Let patient know the Breast Center will follow up with her within the next couple weeks with results of her mammogram by letter or phone. Angela Khan verbalized understanding.  Avyaan Summer, Kathaleen Maser, RN 9:32 AM

## 2022-12-02 IMAGING — MG MM DIGITAL SCREENING BILAT W/ TOMO AND CAD
8 series · 8 of 24 positions shown · non-contrast
Comparison: Previous exam(s).

CLINICAL DATA: Screening.

EXAM:
DIGITAL SCREENING BILATERAL MAMMOGRAM WITH TOMO AND CAD

[R MLO synth-2D]
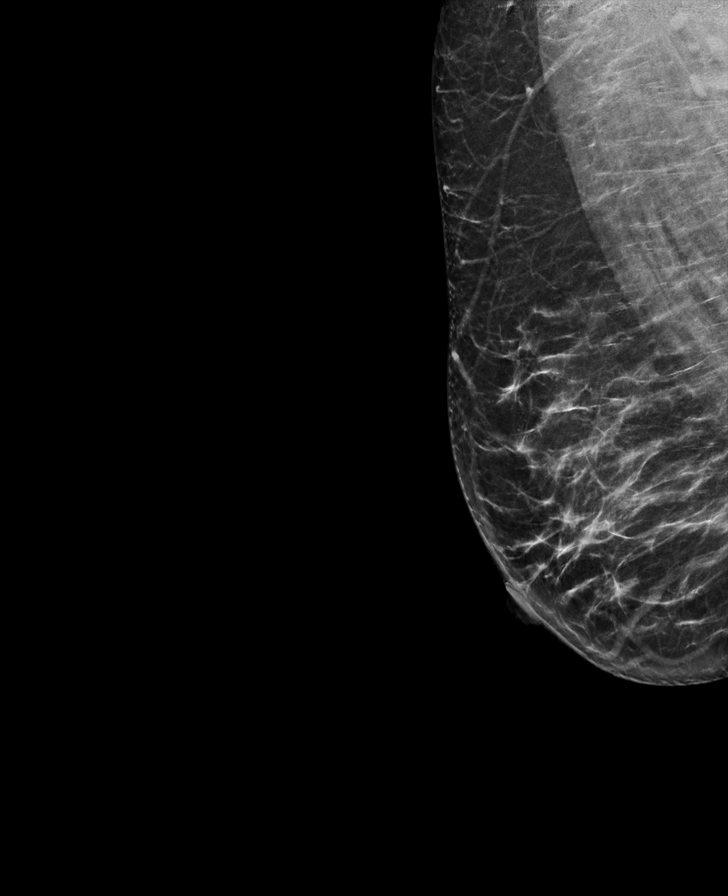

[R CC synth-2D]
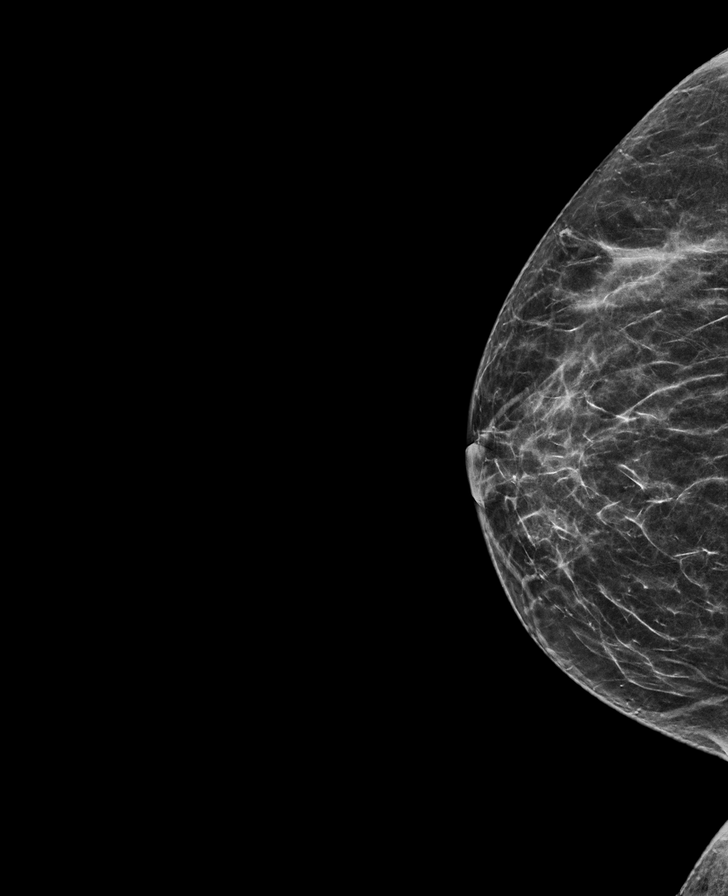

[L CC synth-2D]
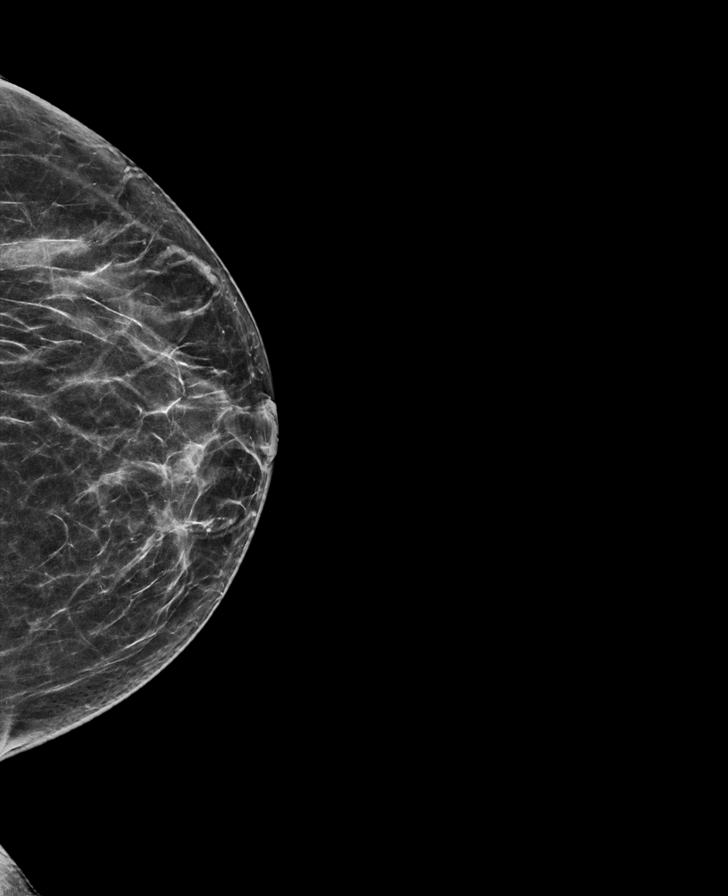

[L MLO synth-2D]
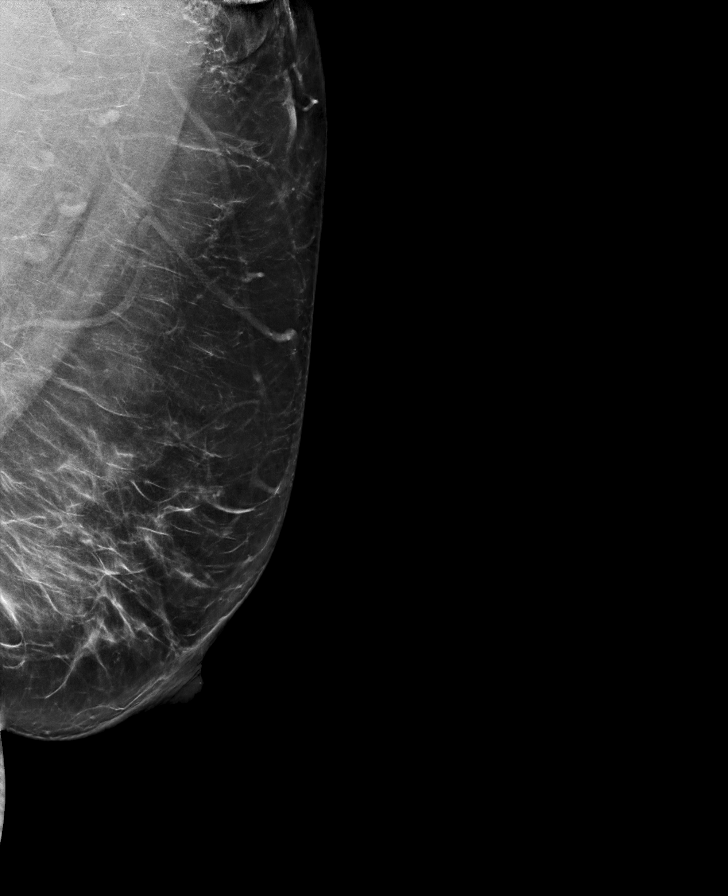

[L MLO tomo · tomo slice 42/83.0]
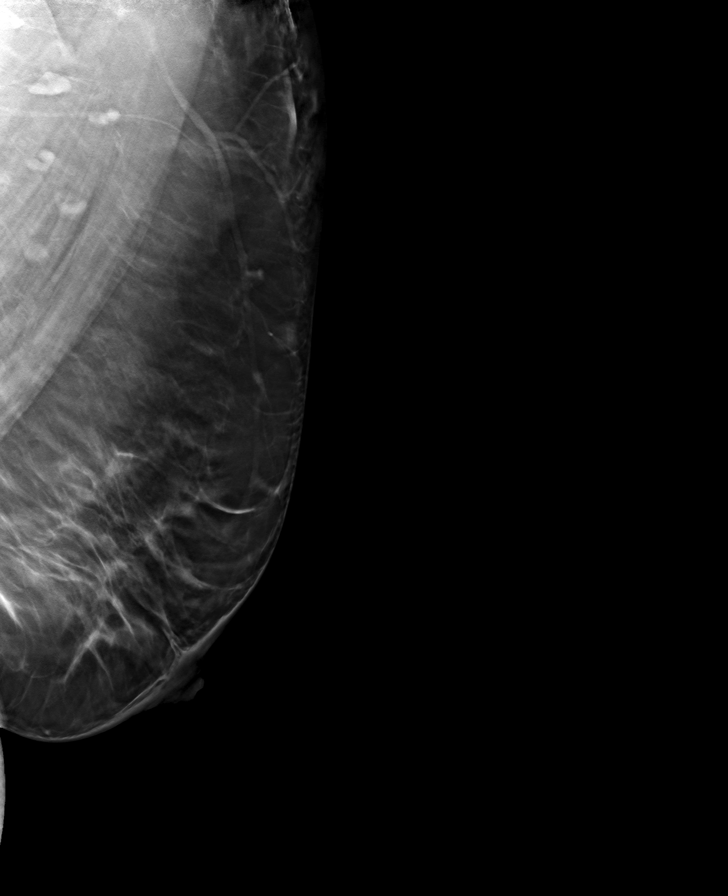

[R MLO tomo · tomo slice 33/66.0]
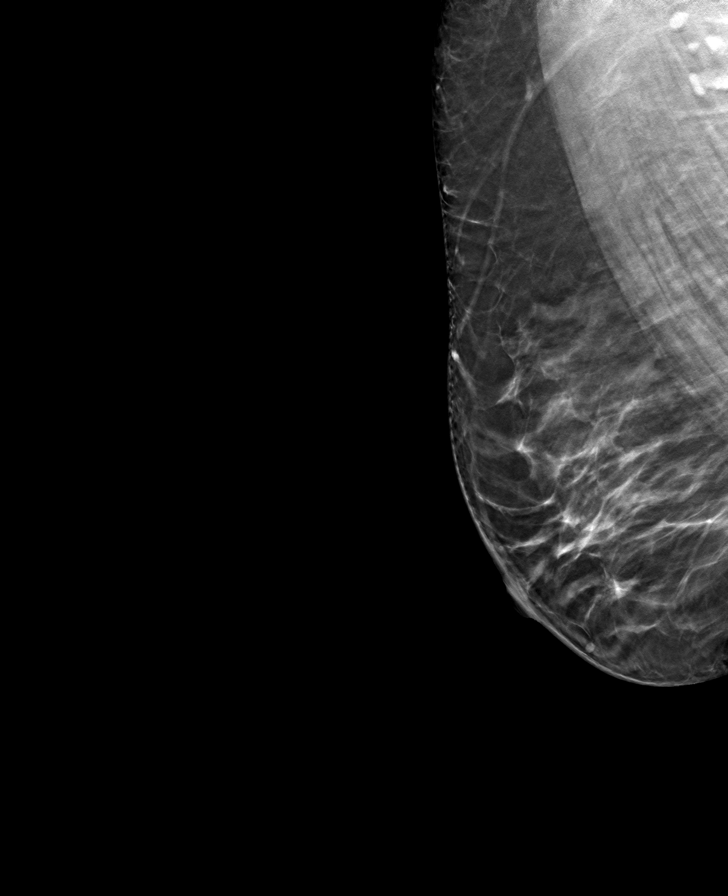

[L CC tomo · tomo slice 31/62.0]
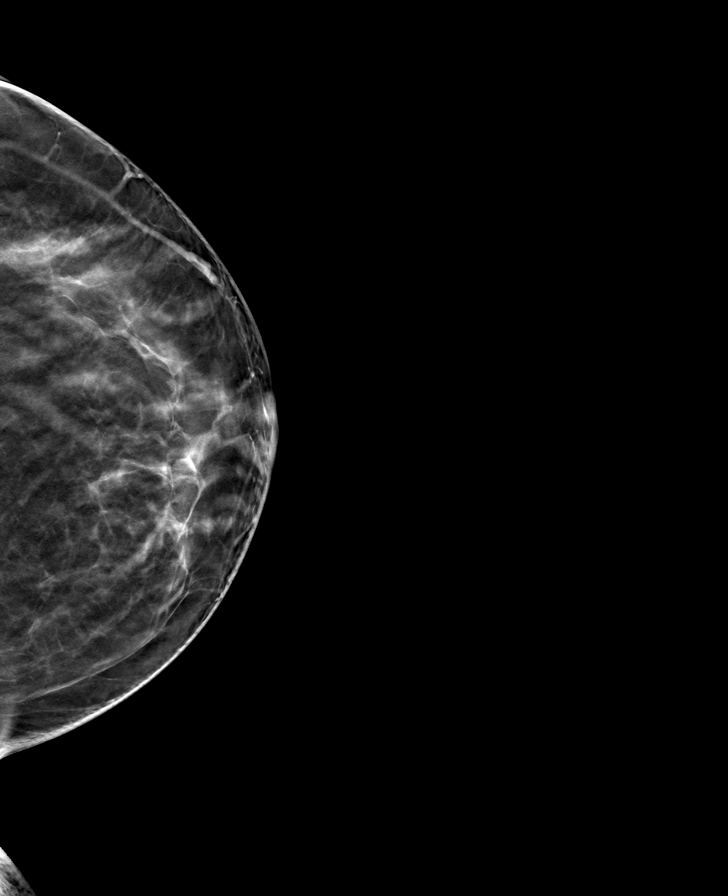

[R CC tomo · tomo slice 27/54.0]
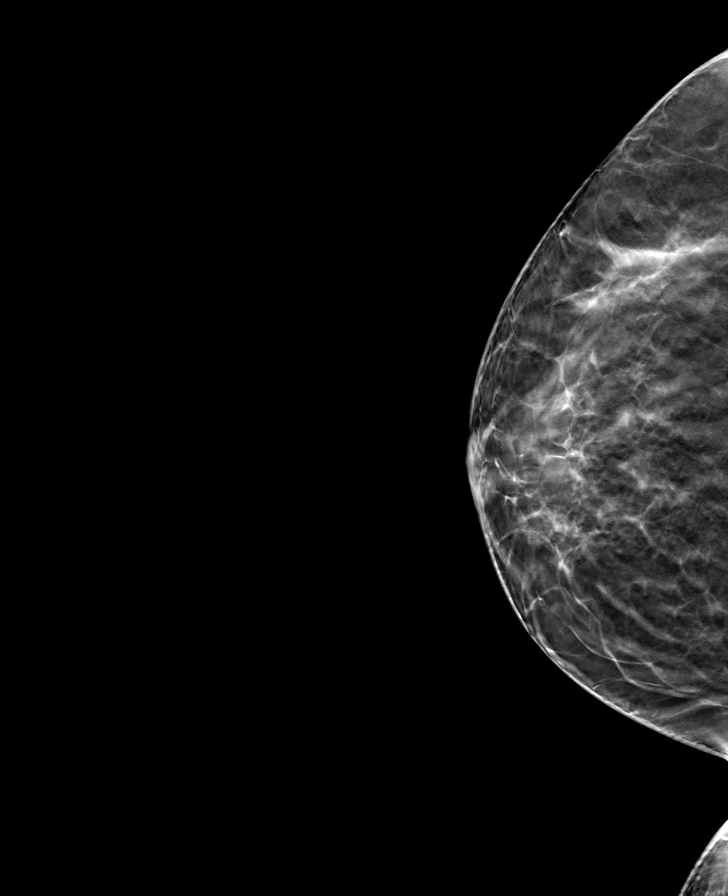

[8 of 24 positions shown; findings below may reference images not displayed]

ACR Breast Density Category c: The breast tissue is heterogeneously
dense, which may obscure small masses.
FINDINGS: There are no findings suspicious for malignancy. The images were
evaluated with computer-aided detection.
IMPRESSION: No mammographic evidence of malignancy. A result letter of this
screening mammogram will be mailed directly to the patient.

RECOMMENDATION:
Screening mammogram in one year. (Code:[id number])

BI-RADS CATEGORY  1: Negative.

## 2022-12-29 ENCOUNTER — Telehealth: Payer: Self-pay

## 2022-12-29 NOTE — Telephone Encounter (Signed)
Telephoned patient at mobile number using interpreter, Rudene Anda. Left a voice message with BCCCP contact information.

## 2024-03-03 ENCOUNTER — Encounter (HOSPITAL_COMMUNITY): Payer: Self-pay

## 2024-03-03 ENCOUNTER — Ambulatory Visit (HOSPITAL_COMMUNITY)
Admission: EM | Admit: 2024-03-03 | Discharge: 2024-03-03 | Disposition: A | Discharge status: Home / Self Care | Attending: Emergency Medicine | Admitting: Emergency Medicine

## 2024-03-03 DIAGNOSIS — R519 Headache, unspecified: Secondary | ICD-10-CM

## 2024-03-03 MED ORDER — METOCLOPRAMIDE HCL 5 MG/ML IJ SOLN
5.0000 mg | Freq: Once | INTRAMUSCULAR | Status: AC
  Administered 2024-03-03: 5 mg via INTRAMUSCULAR

## 2024-03-03 MED ORDER — METOCLOPRAMIDE HCL 5 MG/ML IJ SOLN
INTRAMUSCULAR | Status: AC
  Filled 2024-03-03: qty 2

## 2024-03-03 MED ORDER — SUMATRIPTAN SUCCINATE 6 MG/0.5ML ~~LOC~~ SOLN
SUBCUTANEOUS | Status: AC
  Filled 2024-03-03: qty 0.5

## 2024-03-03 MED ORDER — DEXAMETHASONE SODIUM PHOSPHATE 10 MG/ML IJ SOLN
10.0000 mg | Freq: Once | INTRAMUSCULAR | Status: AC
  Administered 2024-03-03: 10 mg via INTRAMUSCULAR

## 2024-03-03 MED ORDER — DEXAMETHASONE 10 MG/ML FOR PEDIATRIC ORAL USE
INTRAMUSCULAR | Status: AC
  Filled 2024-03-03: qty 1

## 2024-03-03 MED ORDER — SUMATRIPTAN SUCCINATE 6 MG/0.5ML ~~LOC~~ SOLN
6.0000 mg | Freq: Once | SUBCUTANEOUS | Status: AC
  Administered 2024-03-03: 6 mg via SUBCUTANEOUS

## 2024-03-03 NOTE — ED Triage Notes (Signed)
 Per interpreter, pt c/o frontal headaches, weakness, can't sleep, and weakness in her feet. Took OTC with no relief.

## 2024-03-03 NOTE — ED Provider Notes (Signed)
 [facility]    CSN: [id number] Arrival date & time: 03/03/24  1434     History   Chief Complaint Chief Complaint  Patient presents with   Headache    HPI Angela Khan is a 53 y.o. female.  Medical interpretor used for encounter  Here with 2 months of intermittent headache Worsening in the last 1 week. Today rating 10/10 pain. However does not report the worst headache of her life. No head injury or falls. Denies dizziness, vision changes, weakness of extremities, balance issues.  Over last 2 days developed nausea and loss of appetite. No vomiting.  Has not attempted intervention yet  History reviewed. No pertinent past medical history.  Patient Active Problem List   Diagnosis Date Noted   Screening breast examination 11/20/2019    History reviewed. No pertinent surgical history.  OB History     Gravida  7   Para  6   Term  6   Preterm      AB  1   Living  5      SAB  1   IAB      Ectopic      Multiple      Live Births               Home Medications    Prior to Admission medications   Medication Sig Start Date End Date Taking? Authorizing Provider  OVER THE COUNTER MEDICATION Take 1 tablet by mouth as needed. Over the counter medicine called XL-3 which is a cough/cold medication    [provider]    Family History History reviewed. No pertinent family history.  Social History Social History   Tobacco Use   Smoking status: Never   Smokeless tobacco: Never  Vaping Use   Vaping status: Never Used  Substance Use Topics   Alcohol use: No   Drug use: Never     Allergies   Patient has no known allergies.   Review of Systems Review of Systems  Neurological:  Positive for headaches.   Per HPI  Physical Exam Triage Vital Signs ED Triage Vitals  Encounter Vitals Group     BP 03/03/24 1507 (!) 154/96     Systolic BP Percentile --      Diastolic BP Percentile --      Pulse Rate 03/03/24 1507 82     Resp  03/03/24 1507 18     Temp 03/03/24 1507 98.8 F (37.1 C)     Temp Source 03/03/24 1507 Oral     SpO2 03/03/24 1507 95 %     Weight --      Height --      Head Circumference --      Peak Flow --      Pain Score 03/03/24 1508 10     Pain Loc --      Pain Education --      Exclude from Growth Chart --    No data found.  Updated Vital Signs BP (!) 148/80   Pulse 82   Temp 98.8 F (37.1 C) (Oral)   Resp 18   SpO2 95%   Physical Exam Vitals and nursing note reviewed.  Constitutional:      General: She is not in acute distress. HENT:     Head: Atraumatic.     Right Ear: Tympanic membrane and ear canal normal.     Left Ear: Tympanic membrane and ear canal normal.     Mouth/Throat:  Mouth: Mucous membranes are moist.     Pharynx: Oropharynx is clear.  Eyes:     Extraocular Movements: Extraocular movements intact.     Conjunctiva/sclera: Conjunctivae normal.     Pupils: Pupils are equal, round, and reactive to light.  Cardiovascular:     Rate and Rhythm: Normal rate and regular rhythm.     Pulses: Normal pulses.     Heart sounds: Normal heart sounds.  Pulmonary:     Effort: Pulmonary effort is normal.     Breath sounds: Normal breath sounds.  Abdominal:     General: There is no distension.     Palpations: Abdomen is soft.     Tenderness: There is no abdominal tenderness.  Musculoskeletal:        General: Normal range of motion.     Cervical back: Normal range of motion.     Comments: No bony tenderness C-L spine  Skin:    General: Skin is warm and dry.  Neurological:     General: No focal deficit present.     Mental Status: She is alert and oriented to person, place, and time.     Cranial Nerves: Cranial nerves 2-12 are intact. No cranial nerve deficit.     Sensory: No sensory deficit.     Motor: No weakness.     Coordination: Coordination normal.     Gait: Gait normal.     Comments: Strength and sensation intact throughout      UC Treatments / Results   Labs (all labs ordered are listed, but only abnormal results are displayed) Labs Reviewed - No data to display  EKG   Radiology No results found.  Procedures Procedures (including critical care time)  Medications Ordered in UC Medications  metoCLOPramide  (REGLAN ) injection 5 mg (5 mg Intramuscular Given 03/03/24 1539)  dexamethasone  (DECADRON ) injection 10 mg (10 mg Intramuscular Given 03/03/24 1539)  SUMAtriptan  (IMITREX ) injection 6 mg (6 mg Subcutaneous Given 03/03/24 1538)    Initial Impression / Assessment and Plan / UC Course  I have reviewed the triage vital signs and the nursing notes.  Pertinent labs & imaging results that were available during my care of the patient were reviewed by me and considered in my medical decision making (see chart for details).  Overall stable vitals IM imitrex , reglan , and decadron  given  On reassessment patient improving, headache down to 6/10 She is neurologically intact, stable vitals. No red flags. Discussion with patient and family. With age 25 and new onset headaches, neurology referral placed. She does not have a PCP. QR code is provided and her granddaughter will help her get established with one asap. Reasons to return to clinic or be seen in the ED are discussed.  Patient and family agree to plan, all questions answered  Final Clinical Impressions(s) / UC Diagnoses   Final diagnoses:  Acute nonintractable headache, unspecified headache type     Discharge Instructions      I have placed a referral to neurology. They will call you to make an appointment for follow up.  If you need medication in the meantime please use tylenol  and/or ibuprofen   Scan the QR code on the last page to get established with a primary care provider as soon as possible   If any severe symptoms, go directly to the emergency department      ED Prescriptions   None    PDMP not reviewed this encounter.   Creighton Doffing, [place] 03/03/24 1649

## 2024-03-03 NOTE — Discharge Instructions (Signed)
 I have placed a referral to neurology. They will call you to make an appointment for follow up.  If you need medication in the meantime please use tylenol  and/or ibuprofen   Scan the QR code on the last page to get established with a primary care provider as soon as possible   If any severe symptoms, go directly to the emergency department

## 2024-03-23 ENCOUNTER — Ambulatory Visit (INDEPENDENT_AMBULATORY_CARE_PROVIDER_SITE_OTHER): Payer: Self-pay | Admitting: Physician Assistant

## 2024-03-23 ENCOUNTER — Encounter: Payer: Self-pay | Admitting: Physician Assistant

## 2024-03-23 VITALS — BP 136/88 | HR 97 | Temp 98.0°F | Ht 60.63 in | Wt 170.0 lb

## 2024-03-23 DIAGNOSIS — H538 Other visual disturbances: Secondary | ICD-10-CM

## 2024-03-23 DIAGNOSIS — Z139 Encounter for screening, unspecified: Secondary | ICD-10-CM

## 2024-03-23 DIAGNOSIS — E119 Type 2 diabetes mellitus without complications: Secondary | ICD-10-CM | POA: Insufficient documentation

## 2024-03-23 DIAGNOSIS — R202 Paresthesia of skin: Secondary | ICD-10-CM

## 2024-03-23 DIAGNOSIS — R7303 Prediabetes: Secondary | ICD-10-CM | POA: Insufficient documentation

## 2024-03-23 DIAGNOSIS — R0600 Dyspnea, unspecified: Secondary | ICD-10-CM

## 2024-03-23 NOTE — Patient Instructions (Signed)
-  It was a pleasure to see you today! Please review your visit summary for helpful information -Lab results are usually available within 1-2 days and we will call once reviewed -I would encourage you to follow your care via MyChart where you can access lab results, notes, messages, and more -If you feel that we did a nice job today, please complete your after-visit survey and leave [place] a Google review! Your CMA today was [place] and your provider was Alvester Morin, PA-C, DMSc

## 2024-03-23 NOTE — Progress Notes (Signed)
 Date:  03/23/2024   Name:  Angela Khan   DOB:  04/01/1971   MRN:  [id number]  Today's visit completed with help from Spanish interpreter  Chief Complaint: Burning of Scalp (Patient states she has been having a burning sensation of her scalp for the last 6 months), Shortness of Breath (Patient states she has been noticing SOB after she eats for the last 6 months. States she does not have any feelings of choking, just SOB after eating. States she does not feel SOB any other time. ), and Blurred Vision (Patient states she has been experiencing blurred vision for the last 6 months. States the blurred vision comes and goes, mainly when watching tv. )  HPI Angela Khan is a pleasant 53 year old Spanish-speaking female with hx preDM and mild HLD who presents to the clinic today to establish care and discuss several concerns.  Unfortunately she is self-pay at the time of our visit, and long overdue for routine care.  It seems like she saw GYN back in 2021 with labs showing prediabetes and mild HLD.  She complains of focal scalp headaches described as "burning", gesturing to the midline superoposterior scalp.  These occur every 3 to 4 days, for which she takes ibuprofen with some relief.  Also complaining of intermittent blurry vision, particularly when watching television, has not been to the eye doctor due to uninsured status.  Additionally she complains of shortness of breath, but only after eating.  She started taking an over-the-counter antacid medication (though she does not know the name) and states this provides some relief.  She does not take any prescription medications.  SDOH was reviewed, seems like she has some food insecurity and of course could use some help obtaining health insurance.  Medication list has been reviewed and updated.  No outpatient medications have been marked as taking for the 03/23/24 encounter (Office Visit) with Leopoldo Rancher, PA.     Review of Systems  Patient  Active Problem List   Diagnosis Date Noted   Prediabetes 03/23/2024   Screening breast examination 11/20/2019    No Known Allergies   There is no immunization history on file for this patient.  History reviewed. No pertinent surgical history.  Social History   Tobacco Use   Smoking status: Never   Smokeless tobacco: Never  Vaping Use   Vaping status: Never Used  Substance Use Topics   Alcohol use: No   Drug use: Never    History reviewed. No pertinent family history.      03/23/2024   10:46 AM 05/16/2018    3:03 PM  GAD 7 : Generalized Anxiety Score  Nervous, Anxious, on Edge 0 2  Control/stop worrying 0 1  Worry too much - different things 0 0  Trouble relaxing 0 0  Restless 0 0  Easily annoyed or irritable 0 0  Afraid - awful might happen 0 0  Total GAD 7 Score 0 3  Anxiety Difficulty Not difficult at all        03/23/2024   10:45 AM 05/16/2018    3:03 PM  Depression screen PHQ 2/9  Decreased Interest 1 3  Down, Depressed, Hopeless 1 0  PHQ - 2 Score 2 3  Altered sleeping 1 3  Tired, decreased energy 1 1  Change in appetite 0 --  Feeling bad or failure about yourself  0 0  Trouble concentrating 1 1  Moving slowly or fidgety/restless 0 1  Suicidal thoughts 0 0  PHQ-9 Score  5 9  Difficult doing work/chores Somewhat difficult     BP Readings from Last 3 Encounters:  03/23/24 136/88  03/03/24 (!) 148/80  12/16/20 130/80    Wt Readings from Last 3 Encounters:  03/23/24 170 lb (77.1 kg)  12/16/20 172 lb 4.8 oz (78.2 kg)  12/17/19 169 lb (76.7 kg)    BP 136/88   Pulse 97   Temp 98 F (36.7 C) (Oral)   Ht 5' 0.63" (1.54 m)   Wt 170 lb (77.1 kg)   LMP  (LMP Unknown)   SpO2 98%   BMI 32.51 kg/m   Physical Exam Vitals and nursing note reviewed.  Constitutional:      Appearance: Normal appearance.  HENT:     Head:     Comments: No palpable abnormalities of the scalp, no overlying skin changes.  Patient reports the area where she gets her  headaches is mildly tender to palpation today. Neck:     Vascular: No carotid bruit.  Cardiovascular:     Rate and Rhythm: Normal rate and regular rhythm.     Heart sounds: No murmur heard.    No friction rub. No gallop.  Pulmonary:     Effort: Pulmonary effort is normal.     Breath sounds: Normal breath sounds.  Abdominal:     General: There is no distension.     Palpations: Abdomen is soft.     Tenderness: There is no abdominal tenderness.  Musculoskeletal:        General: Normal range of motion.  Skin:    General: Skin is warm and dry.  Neurological:     Mental Status: She is alert and oriented to person, place, and time.     Gait: Gait is intact.  Psychiatric:        Mood and Affect: Mood and affect normal.     Recent Labs     Component Value Date/Time   GLUCOSE 117 (H) 12/17/2019 0954    Lab Results  Component Value Date   WBC 14.3 (H) 02/28/2012   HGB 13.8 02/28/2012   HCT 40.3 02/28/2012   MCV 86.3 02/28/2012   PLT 202 02/28/2012   Lab Results  Component Value Date   HGBA1C 5.9 (H) 12/17/2019   Lab Results  Component Value Date   CHOL 174 12/17/2019   HDL 36 (L) 12/17/2019   LDLCALC 117 (H) 12/17/2019   TRIG 115 12/17/2019   No results found for: "TSH"   Assessment and Plan:  1. Paresthesia of scalp (Primary) Will be checking CBC, CMP, A1c today.  Patient reassured of normal exam overall.  Would be a good idea to have an eye exam to rule out worsening vision as a potential cause of her headaches.  For now, continue treating with OTC analgesics.  Would like to see patient for routine follow-up on this, but she does not currently have health insurance.  Will be referring to case management to see if they can help in any way.  - CBC with Differential/Platelet - Comprehensive metabolic panel with GFR  2. Blurry vision, bilateral - CBC with Differential/Platelet - Comprehensive metabolic panel with GFR - Hemoglobin A1c  3. Prediabetes - CBC with  Differential/Platelet - Comprehensive metabolic panel with GFR - Hemoglobin A1c  4. Dyspnea, unspecified type Postprandial dyspnea that does not happen any other time.  Denies exertional dyspnea.  Heart and lung sound normal on exam today.  Low suspicion for serious etiology.  Continue with OTC antacid medication as  needed.  5. Encounter for screening involving social determinants of health (SDoH) - AMB Referral VBCI Care Management   Return if symptoms worsen or fail to improve.    Cody Das, PA-C, DMSc, Nutritionist [facility] [phone]

## 2024-03-24 LAB — CBC WITH DIFFERENTIAL/PLATELET
Basophils Absolute: 0 10*3/uL (ref 0.0–0.2)
Basos: 0 %
EOS (ABSOLUTE): 0.2 10*3/uL (ref 0.0–0.4)
Eos: 2 %
Hematocrit: 42.2 % (ref 34.0–46.6)
Hemoglobin: 13.8 g/dL (ref 11.1–15.9)
Immature Grans (Abs): 0.1 10*3/uL (ref 0.0–0.1)
Immature Granulocytes: 1 %
Lymphocytes Absolute: 2.5 10*3/uL (ref 0.7–3.1)
Lymphs: 24 %
MCH: 30.3 pg (ref 26.6–33.0)
MCHC: 32.7 g/dL (ref 31.5–35.7)
MCV: 93 fL (ref 79–97)
Monocytes Absolute: 0.6 10*3/uL (ref 0.1–0.9)
Monocytes: 6 %
Neutrophils Absolute: 7.1 10*3/uL — ABNORMAL HIGH (ref 1.4–7.0)
Neutrophils: 67 %
Platelets: 320 10*3/uL (ref 150–450)
RBC: 4.56 x10E6/uL (ref 3.77–5.28)
RDW: 12.4 % (ref 11.7–15.4)
WBC: 10.4 10*3/uL (ref 3.4–10.8)

## 2024-03-24 LAB — COMPREHENSIVE METABOLIC PANEL WITH GFR
ALT: 16 IU/L (ref 0–32)
AST: 16 IU/L (ref 0–40)
Albumin: 4.4 g/dL (ref 3.9–4.9)
Alkaline Phosphatase: 69 IU/L (ref 44–121)
BUN/Creatinine Ratio: 16 (ref 9–23)
BUN: 9 mg/dL (ref 6–24)
Bilirubin Total: 0.4 mg/dL (ref 0.0–1.2)
CO2: 24 mmol/L (ref 20–29)
Calcium: 9.5 mg/dL (ref 8.7–10.2)
Chloride: 99 mmol/L (ref 96–106)
Creatinine, Ser: 0.58 mg/dL (ref 0.57–1.00)
Globulin, Total: 2.5 g/dL (ref 1.5–4.5)
Glucose: 135 mg/dL — ABNORMAL HIGH (ref 70–99)
Potassium: 4.2 mmol/L (ref 3.5–5.2)
Sodium: 138 mmol/L (ref 134–144)
Total Protein: 6.9 g/dL (ref 6.0–8.5)
eGFR: 110 mL/min/{1.73_m2} (ref >59)

## 2024-03-24 LAB — HEMOGLOBIN A1C
Est. average glucose Bld gHb Est-mCnc: 169 mg/dL
Hgb A1c MFr Bld: 7.5 % — ABNORMAL HIGH (ref 4.8–5.6)

## 2024-03-26 ENCOUNTER — Other Ambulatory Visit: Payer: Self-pay | Admitting: Physician Assistant

## 2024-03-26 ENCOUNTER — Telehealth: Payer: Self-pay

## 2024-03-26 DIAGNOSIS — E1165 Type 2 diabetes mellitus with hyperglycemia: Secondary | ICD-10-CM

## 2024-03-26 MED ORDER — METFORMIN HCL 500 MG PO TABS: ORAL_TABLET | ORAL | 3 refills | Status: DC

## 2024-03-26 NOTE — Progress Notes (Signed)
 Spoke with patient. Patient has been made aware or results and instructions per provider.

## 2024-03-26 NOTE — Telephone Encounter (Signed)
-----   Message from Danelle Dunning sent at 03/26/2024 12:38 PM EDT ----- Spanish Speaking. She has a new diagnosis of type 2 diabetes which explains several of her symptoms. The diabetes test is called an A1c which represents the sugar over the last 3 months. Normal is less than 5.7% and she was 7.5% which reflects mild diabetes. I recommend reducing consumption of simple carbohydrates such as white starches (bread, pasta, rice) and refined sugar found in desserts and sweetened beverages including juice, sweet tea, and soda.  I have sent a prescription for metformin for her with instructions to take 1 tablet with her first meal for the next 2 weeks. After that, she should take one tablet with breakfast and 1 with dinner every day until her A1c is checked again. Most common side effect is diarrhea especially in the first week, but then it usually improves.   She will need an eye exam this year and should also have an office visit in 3 months to recheck the diabetes. We would be happy to continue seeing her, but I also recognize she lives in [place] so it may be best for her to find care closer to home.

## 2024-03-26 NOTE — Telephone Encounter (Signed)
 Spoke with patient, discussed lab results and instructions from provider. Patient verbalized understanding. 3 month follow-up appointment has been made and patient is aware. She stated that she will look for an office closer to her home if she does not find an office she will attend the appointment that was scheduled with Cody Das. I stressed the importance of following up in 3 months for her new DX of diabetes and starting the medication. She verbalized understanding and denied any questions.

## 2024-03-29 ENCOUNTER — Telehealth: Payer: Self-pay

## 2024-03-29 NOTE — Progress Notes (Signed)
 Complex Care Management Note  Care Guide Note 03/29/2024 Name: Angela Khan MRN: [id number] DOB: [birth year]  Angela Khan is a 53 y.o. year old female who sees Leopoldo Rancher, [place] for primary care. I reached out to Austine Blunt by phone today to offer complex care management services.  [name] was given information about Complex Care Management services today including:   The Complex Care Management services include support from the care team which includes your Nurse Care Manager, Clinical Social Worker, or Pharmacist.  The Complex Care Management team is here to help remove barriers to the health concerns and goals most important to you. Complex Care Management services are voluntary, and the patient may decline or stop services at any time by request to their care team member.   Complex Care Management Consent Status: Patient agreed to services and verbal consent obtained.   Follow up plan:  Telephone appointment with complex care management team member scheduled for:  04/05/2024  Encounter Outcome:  Patient Scheduled  Lenton Rail , RMA     [facility], [facility] Guide  Direct Dial: [phone]  Website: [email or web address]

## 2024-04-05 ENCOUNTER — Other Ambulatory Visit: Payer: Self-pay

## 2024-04-05 NOTE — Patient Outreach (Signed)
 Complex Care Management   Visit Note  04/05/2024  Name:  Angela Khan MRN: [id number] DOB: 05-01-1971  Situation: Referral received for Complex Care Management related to SDOH Barriers:  Housing rent  Food insecurity I obtained verbal consent from Patient.  Visit completed with patient   on the phone  Background:  No past medical history on file.  Assessment: SW completed a telephone outreach with patient using a spanish interpreter. Patient states she does not have any income and has not applied for foodstamps or Medicaid. Patient is not respoinible for rent or utilities and is living with family. Patient is familiar with DSS. SW and patient agreed for resources to be mailed.  SDOH Interventions    Flowsheet Row Patient Outreach Telephone from 04/05/2024 in [place] POPULATION HEALTH DEPARTMENT Office Visit from 03/23/2024 in [facility] Primary Care & Sports Medicine at [facility]  SDOH Interventions    Food Insecurity Interventions Community Resources Provided Intervention Not Indicated  Housing Interventions Community Resources Provided --  Transportation Interventions -- Intervention Not Indicated  Utilities Interventions -- Intervention Not Indicated  Alcohol Usage Interventions -- Intervention Not Indicated (Score <7)  Financial Strain Interventions Community Resources Provided Intervention Not Indicated  Stress Interventions -- Intervention Not Indicated  Social Connections Interventions -- Intervention Not Indicated  Health Literacy Interventions -- Intervention Not Indicated         Recommendation:   No recommendations at this time.  Follow Up Plan:   Telephone follow-up 04/19/24 at 3:30  Valora Gear, BSW, [facility]  Value Based [facility] Social Worker, Population Health [phone]

## 2024-04-05 NOTE — Patient Instructions (Signed)
 Visit Information  Thank you for taking time to visit with me today. Please don't hesitate to contact me if I can be of assistance to you before our next scheduled appointment.  Our next appointment is by telephone on 04/19/24 at 3:30 Please call the care guide team at [phone] if you need to cancel or reschedule your appointment.   Following is a copy of your care plan:   Goals Addressed   None     Please call the Suicide and Crisis Lifeline: 988 call the [place]  National Suicide Prevention Lifeline: (581)573-3720 or TTY: (563) 245-0448 TTY 289 076 8388) to talk to a trained counselor call 1-800-273-TALK (toll free, 24 hour hotline) go to [facility] [address], [place] [phone]) call 911 if you are experiencing a Mental Health or Behavioral Health Crisis or need someone to talk to.  The patient verbalized understanding of instructions, educational materials, and care plan provided today and DECLINED offer to receive copy of patient instructions, educational materials, and care plan.   Valora Gear, Florestine Hurl, MHA [place]  [facility] Social Worker, Population Health [phone]

## 2024-04-19 ENCOUNTER — Other Ambulatory Visit: Payer: Self-pay

## 2024-04-19 NOTE — Patient Outreach (Signed)
 Complex Care Management   Visit Note  04/19/2024  Name:  Angela Khan MRN: [id number] DOB: August 13, 1971  Situation: Referral received for Complex Care Management related to SDOH Barriers:  Housing rent Food insecurity I obtained verbal consent from Patient.  Visit completed with patient  on the phone  Background:  No past medical history on file.  Assessment: SW completed a telephone outreach with patient using pacific interpreter, patient states she did not receive the resources SW sent. SW and patient agreed for resources to be resent via mail.   SDOH Interventions    Flowsheet Row Patient Outreach Telephone from 04/05/2024 in [place] POPULATION HEALTH DEPARTMENT Office Visit from 03/23/2024 in [facility] Primary Care & Sports Medicine at [facility]  SDOH Interventions    Food Insecurity Interventions Community Resources Provided Intervention Not Indicated  Housing Interventions Community Resources Provided --  Transportation Interventions -- Intervention Not Indicated  Utilities Interventions -- Intervention Not Indicated  Alcohol Usage Interventions -- Intervention Not Indicated (Score <7)  Financial Strain Interventions Community Resources Provided Intervention Not Indicated  Stress Interventions -- Intervention Not Indicated  Social Connections Interventions -- Intervention Not Indicated  Health Literacy Interventions -- Intervention Not Indicated       Recommendation:   No recommendations at this time  Follow Up Plan:   Telephone follow-up /05/03/24 at 1pm / Valora Gear, BSW, [facility] [place]  [facility] Social Worker, Population Health [phone]

## 2024-04-19 NOTE — Patient Instructions (Signed)
 Visit Information  Thank you for taking time to visit with me today. Please don't hesitate to contact me if I can be of assistance to you before our next scheduled appointment.  Your next care management appointment is by telephone on 05/03/24 at 1pm    Please call the care guide team at [phone] if you need to cancel, schedule, or reschedule an appointment.   Please call the Suicide and Crisis Lifeline: 988 call the [place]  National Suicide Prevention Lifeline: (306) 859-7092 or TTY: 251-439-6063 TTY (603)287-9032) to talk to a trained counselor call 1-800-273-TALK (toll free, 24 hour hotline) go to [facility] [address], [place] [phone]) call 911 if you are experiencing a Mental Health or Behavioral Health Crisis or need someone to talk to.  Valora Gear, Florestine Hurl, MHA [place]  [facility] Social Worker, Population Health [phone]

## 2024-05-04 ENCOUNTER — Other Ambulatory Visit: Payer: Self-pay

## 2024-05-04 NOTE — Patient Instructions (Signed)
 Visit Information  Thank you for taking time to visit with me today. Please don't hesitate to contact me if I can be of assistance to you before our next scheduled appointment.  Your next care management appointment is no further scheduled appointments.      Please call the care guide team at [phone] if you need to cancel, schedule, or reschedule an appointment.   Please call the Suicide and Crisis Lifeline: 988 call the [place]  National Suicide Prevention Lifeline: (716)377-3922 or TTY: 2052722180 TTY 4352964442) to talk to a trained counselor call 1-800-273-TALK (toll free, 24 hour hotline) go to [facility] [address] [place] [phone]) call 911 if you are experiencing a Mental Health or Behavioral Health Crisis or need someone to talk to.  Valora Gear, Florestine Hurl, MHA [place]  [facility] Social Worker, Population Health [phone]

## 2024-05-04 NOTE — Patient Outreach (Signed)
 Complex Care Management   Visit Note  05/04/2024  Name:  Angela Khan MRN: [id number] DOB: Dec 28, 1970  Situation: Referral received for Complex Care Management related to SDOH Barriers:  Housing rent Food insecurity I obtained verbal consent from Patient.  Visit completed with patient  on the phone  Background:  No past medical history on file.  Assessment: SW completed a telephone outreach with patient using pacific interpreter, patient states she did receive the resources from SW and will visit the locations when she gets a chance. Patient states no other resources are needed at this time.   SDOH Interventions    Flowsheet Row Patient Outreach Telephone from 04/05/2024 in [place] POPULATION HEALTH DEPARTMENT Office Visit from 03/23/2024 in [facility] Primary Care & Sports Medicine at [facility]  SDOH Interventions    Food Insecurity Interventions Community Resources Provided Intervention Not Indicated  Housing Interventions Community Resources Provided --  Transportation Interventions -- Intervention Not Indicated  Utilities Interventions -- Intervention Not Indicated  Alcohol Usage Interventions -- Intervention Not Indicated (Score <7)  Financial Strain Interventions Community Resources Provided Intervention Not Indicated  Stress Interventions -- Intervention Not Indicated  Social Connections Interventions -- Intervention Not Indicated  Health Literacy Interventions -- Intervention Not Indicated    Recommendation:   No recommendations at this time.  Follow Up Plan:   Patient has met all care management goals. Care Management case will be closed. Patient has been provided contact information should new needs arise.   Valora Gear, Florestine Hurl, MHA [place]  [facility] Social Worker, Population Health [phone]

## 2024-06-19 ENCOUNTER — Ambulatory Visit (INDEPENDENT_AMBULATORY_CARE_PROVIDER_SITE_OTHER): Payer: Self-pay | Admitting: Physician Assistant

## 2024-06-19 ENCOUNTER — Encounter: Payer: Self-pay | Admitting: Physician Assistant

## 2024-06-19 VITALS — BP 122/88 | HR 86 | Temp 98.4°F | Ht 60.0 in | Wt 171.0 lb

## 2024-06-19 DIAGNOSIS — Z7984 Long term (current) use of oral hypoglycemic drugs: Secondary | ICD-10-CM | POA: Insufficient documentation

## 2024-06-19 DIAGNOSIS — E1165 Type 2 diabetes mellitus with hyperglycemia: Secondary | ICD-10-CM

## 2024-06-19 LAB — POCT GLYCOSYLATED HEMOGLOBIN (HGB A1C): Hemoglobin A1C: 6.5 % — AB (ref 4.0–5.6)

## 2024-06-19 MED ORDER — METFORMIN HCL 500 MG PO TABS: 500.0000 mg | ORAL_TABLET | Freq: Two times a day (BID) | ORAL | 3 refills | Status: AC

## 2024-06-19 NOTE — Assessment & Plan Note (Signed)
 A1c 6.5% today reflecting good glycemic control on metformin  monotherapy, refill as below.  Will try to get her in for diabetic eye exam.  Due to her self-pay status, will visits since that she has good glycemic control, plan for 12-month follow-up.

## 2024-06-19 NOTE — Progress Notes (Signed)
 Date:  06/19/2024   Name:  Angela Khan   DOB:  [birth year]   MRN:  [id number]  *Today's visit completed using Spanish interpreter  Chief Complaint: Diabetes (No side effects from medication )  HPI Angela Khan returns for 33-month f/u visit having started metformin  500 mg BID for newly diagnosed DM2 with last A1c 7.5%. She reports taking the medication with good tolerance and compliance. No particular complaints today.  Unfortunately she still does not have health insurance. Says she does not have the papers (taxes, etc) to apply for Medicaid and does not have employment at present.   She is in need of diabetic eye exam.    Medication list has been reviewed and updated.  Current Meds  Medication Sig   [DISCONTINUED] metFORMIN  (GLUCOPHAGE ) 500 MG tablet Take 1 tablet (500 mg total) by mouth daily with breakfast for 14 days, THEN 1 tablet (500 mg total) 2 (two) times daily with a meal.     Review of Systems  Patient Active Problem List   Diagnosis Date Noted   Long term current use of oral hypoglycemic drug 06/19/2024   Type 2 diabetes mellitus (HCC) 03/23/2024    No Known Allergies   There is no immunization history on file for this patient.  History reviewed. No pertinent surgical history.  Social History   Tobacco Use   Smoking status: Never   Smokeless tobacco: Never  Vaping Use   Vaping status: Never Used  Substance Use Topics   Alcohol use: No   Drug use: Never    History reviewed. No pertinent family history.      06/19/2024    9:46 AM 03/23/2024   10:46 AM 05/16/2018    3:03 PM  GAD 7 : Generalized Anxiety Score  Nervous, Anxious, on Edge 0 0 2  Control/stop worrying 0 0 1  Worry too much - different things 0 0 0  Trouble relaxing 0 0 0  Restless 0 0 0  Easily annoyed or irritable 0 0 0  Afraid - awful might happen 0 0 0  Total GAD 7 Score 0 0 3  Anxiety Difficulty Not difficult at all Not difficult at all        06/19/2024    9:46 AM 03/23/2024   10:45  AM 05/16/2018    3:03 PM  Depression screen PHQ 2/9  Decreased Interest 0 1 3  Down, Depressed, Hopeless 0 1 0  PHQ - 2 Score 0 2 3  Altered sleeping  1 3  Tired, decreased energy  1 1  Change in appetite  0 --  Feeling bad or failure about yourself   0 0  Trouble concentrating  1 1  Moving slowly or fidgety/restless  0 1  Suicidal thoughts  0 0  PHQ-9 Score  5 9  Difficult doing work/chores  Somewhat difficult     BP Readings from Last 3 Encounters:  06/19/24 122/88  03/23/24 136/88  03/03/24 (!) 148/80    Wt Readings from Last 3 Encounters:  06/19/24 171 lb (77.6 kg)  03/23/24 170 lb (77.1 kg)  12/16/20 172 lb 4.8 oz (78.2 kg)    BP 122/88   Pulse 86   Temp 98.4 F (36.9 C)   Ht 5' (1.524 m)   Wt 171 lb (77.6 kg)   SpO2 98%   BMI 33.40 kg/m   Physical Exam Vitals and nursing note reviewed.  Constitutional:      Appearance: Normal appearance.  Cardiovascular:  Rate and Rhythm: Normal rate and regular rhythm.     Heart sounds: No murmur heard.    No friction rub. No gallop.  Pulmonary:     Effort: Pulmonary effort is normal.     Breath sounds: Normal breath sounds.  Abdominal:     General: There is no distension.  Musculoskeletal:        General: Normal range of motion.  Skin:    General: Skin is warm and dry.  Neurological:     Mental Status: She is alert and oriented to person, place, and time.     Gait: Gait is intact.  Psychiatric:        Mood and Affect: Mood and affect normal.     Recent Labs     Component Value Date/Time   NA 138 03/23/2024 1147   K 4.2 03/23/2024 1147   CL 99 03/23/2024 1147   CO2 24 03/23/2024 1147   GLUCOSE 135 (H) 03/23/2024 1147   BUN 9 03/23/2024 1147   CREATININE 0.58 03/23/2024 1147   CALCIUM 9.5 03/23/2024 1147   PROT 6.9 03/23/2024 1147   ALBUMIN 4.4 03/23/2024 1147   AST 16 03/23/2024 1147   ALT 16 03/23/2024 1147   ALKPHOS 69 03/23/2024 1147   BILITOT 0.4 03/23/2024 1147    Lab Results  Component  Value Date   WBC 10.4 03/23/2024   HGB 13.8 03/23/2024   HCT 42.2 03/23/2024   MCV 93 03/23/2024   PLT 320 03/23/2024   Lab Results  Component Value Date   HGBA1C 6.5 (A) 06/19/2024   HGBA1C 7.5 (H) 03/23/2024   HGBA1C 5.9 (H) 12/17/2019   Lab Results  Component Value Date   CHOL 174 12/17/2019   HDL 36 (L) 12/17/2019   LDLCALC 117 (H) 12/17/2019   TRIG 115 12/17/2019   No results found for: TSH    Assessment and Plan:  Type 2 diabetes mellitus with hyperglycemia, without long-term current use of insulin (HCC) Assessment & Plan: A1c 6.5% today reflecting good glycemic control on metformin  monotherapy, refill as below.  Will try to get her in for diabetic eye exam.  Due to her self-pay status, will visits since that she has good glycemic control, plan for 17-month follow-up.  Orders: -     POCT glycosylated hemoglobin (Hb A1C) -     metFORMIN  HCl; Take 1 tablet (500 mg total) by mouth 2 (two) times daily with a meal.  Dispense: 180 tablet; Refill: 3  Long term current use of oral hypoglycemic drug     Return in about 6 months (around 12/20/2024) for OV f/u DM2.    Rolan Hoyle, PA-C, DMSc, Nutritionist [facility] Primary Care and Sports Medicine [facility] [phone]

## 2024-06-20 ENCOUNTER — Ambulatory Visit (INDEPENDENT_AMBULATORY_CARE_PROVIDER_SITE_OTHER): Payer: Self-pay | Admitting: Physician Assistant

## 2024-06-20 DIAGNOSIS — E1165 Type 2 diabetes mellitus with hyperglycemia: Secondary | ICD-10-CM

## 2024-06-20 DIAGNOSIS — Z7984 Long term (current) use of oral hypoglycemic drugs: Secondary | ICD-10-CM

## 2024-06-20 LAB — HM DIABETES EYE EXAM

## 2024-06-20 NOTE — Progress Notes (Signed)
 Angela Khan arrived 06/20/2024 and has given verbal consent to obtain images and complete their overdue diabetic retinal screening.  The images have been sent to an ophthalmologist or optometrist for review and interpretation.  Results will be sent back to [name], PA for review.  Patient has been informed they will be contacted when we receive the results via telephone or MyChart

## 2024-06-22 ENCOUNTER — Ambulatory Visit: Payer: Self-pay | Admitting: Physician Assistant

## 2024-06-25 NOTE — Progress Notes (Signed)
 Received results- No diabetic retinopathy shown. Results will be sent to provider. PCP clinical team will need to call patient with results.

## 2024-06-27 ENCOUNTER — Encounter: Payer: Self-pay | Admitting: Physician Assistant

## 2024-09-25 ENCOUNTER — Ambulatory Visit (INDEPENDENT_AMBULATORY_CARE_PROVIDER_SITE_OTHER): Payer: Self-pay

## 2024-09-25 VITALS — BP 134/86 | HR 79 | Temp 98.6°F | Resp 16 | Ht 61.5 in | Wt 192.8 lb

## 2024-09-25 DIAGNOSIS — N951 Menopausal and female climacteric states: Secondary | ICD-10-CM

## 2024-09-25 DIAGNOSIS — Z7984 Long term (current) use of oral hypoglycemic drugs: Secondary | ICD-10-CM

## 2024-09-25 DIAGNOSIS — E1165 Type 2 diabetes mellitus with hyperglycemia: Secondary | ICD-10-CM

## 2024-09-25 DIAGNOSIS — R232 Flushing: Secondary | ICD-10-CM

## 2024-09-25 MED ORDER — ESTRADIOL 1 MG PO TABS: 1.0000 mg | ORAL_TABLET | Freq: Every day | ORAL | 2 refills | Status: DC

## 2024-09-25 NOTE — Progress Notes (Unsigned)
     Patient ID: Angela Khan, female    DOB: 30-Nov-1970  MRN: [id number]  CC: Establish Care   Subjective: Angela Khan is a 53 y.o. female with past medical history of diabetes who presents to clinic to establish care.  Patient's main concern is hot flashes that are worse at night.  Patient reports she does not get monthly menstrual cycles for the past year, they happen every 3 months.  Patient reports some irritability for the past 3 months.   No history of breast cancer, or blood clots No Known Allergies  ROS: Review of Systems Negative except as stated above  PHYSICAL EXAM: BP 134/86   Pulse 79   Temp 98.6 F (37 C) (Oral)   Resp 16   Ht 5' 1.5 (1.562 m)   Wt 192 lb 12.8 oz (87.5 kg)   SpO2 98%   BMI 35.84 kg/m   Physical Exam  General: well-appearing, no acute distress Skin: no jaundice, rashes, or lesions Cardiovascular: regular heart rate and rhythm, normal S1/S2, no murmurs, gallops, or rubs, peripheral pulses 2+ bilaterally Chest: no skeletal deformity, lungs clear to auscultation bilaterally, equal breath sounds bilaterally Abdomen: soft, non-distended, non-tender to palpation, no hepatomegaly, no splenomegaly, normoactive bowel sounds Musculoskeletal: normal gait Extremities: no peripheral edema  ASSESSMENT AND PLAN:  1. Type 2 diabetes mellitus with hyperglycemia, without long-term current use of insulin (HCC) - Continue metformin  500 mg twice daily - Urine Albumin/Creatinine with ratio (send out) [LAB689]  2. Peri-menopause (Primary) -Patient's symptoms are consistent with a diagnosis of perimenopause.  Discussed with patient use of hormone replacement to address symptoms.  Plan to follow-up in 1 month to assess for symptom relief. - estradiol (ESTRACE) 1 MG tablet; Take 1 tablet (1 mg total) by mouth daily.  Dispense: 30 tablet; Refill: 2  3. Hot flashes    Patient was given the opportunity to ask questions.  Patient verbalized understanding of the  plan and was able to repeat key elements of the plan.    Orders Placed This Encounter  Procedures   Urine Albumin/Creatinine with ratio (send out) [LAB689]   POCT glycosylated hemoglobin (Hb A1C)     Requested Prescriptions   Signed Prescriptions Disp Refills   estradiol (ESTRACE) 1 MG tablet 30 tablet 2    Sig: Take 1 tablet (1 mg total) by mouth daily.    Return in about 1 month (around 10/25/2024) for physical and pap smear.  Sula Leavy Rode, PA-C

## 2024-09-26 LAB — MICROALBUMIN / CREATININE URINE RATIO
Creatinine, Urine: 152.1 mg/dL
Microalb/Creat Ratio: 6 mg/g{creat} (ref 0–29)
Microalbumin, Urine: 9.6 ug/mL

## 2024-09-27 ENCOUNTER — Ambulatory Visit: Payer: Self-pay

## 2024-11-01 ENCOUNTER — Ambulatory Visit: Payer: Self-pay

## 2024-11-01 ENCOUNTER — Other Ambulatory Visit (HOSPITAL_COMMUNITY)
Admission: RE | Admit: 2024-11-01 | Discharge: 2024-11-01 | Disposition: A | Discharge status: Home / Self Care | Payer: Self-pay | Source: Ambulatory Visit

## 2024-11-01 VITALS — BP 137/83 | HR 87 | Temp 98.2°F | Resp 16 | Ht 61.0 in | Wt 173.4 lb

## 2024-11-01 DIAGNOSIS — N951 Menopausal and female climacteric states: Secondary | ICD-10-CM

## 2024-11-01 DIAGNOSIS — Z0000 Encounter for general adult medical examination without abnormal findings: Secondary | ICD-10-CM

## 2024-11-01 DIAGNOSIS — Z124 Encounter for screening for malignant neoplasm of cervix: Secondary | ICD-10-CM

## 2024-11-01 DIAGNOSIS — M25511 Pain in right shoulder: Secondary | ICD-10-CM

## 2024-11-01 NOTE — Progress Notes (Signed)
 Patient ID: Angela Khan, female    DOB: 1970-12-28  MRN: [id number]  CC: Follow-up (Pap, pain in right shoulder)   Subjective: Angela Khan is a 53 y.o. female with past medical history of diabetes who presents to clinic for annual physical exam and pap smear. Pt reports that hormone therapy is helping with her hot flash symptoms. Reports feeling well overall. Main concern is right shoulder pain for the past 3 weeks. Denies injury, trauma or recent fall. Pain described as achy and worse whenever she is doing activities with her arms.    Patient Active Problem List   Diagnosis Date Noted   Long term current use of oral hypoglycemic drug 06/19/2024   Type 2 diabetes mellitus (HCC) 03/23/2024     Medications Ordered Prior to Encounter[1]  Allergies[2]  Social History   Socioeconomic History   Marital status: Single    Spouse name: Not on file   Number of children: 5   Years of education: Not on file   Highest education level: Never attended school  Occupational History   Not on file  Tobacco Use   Smoking status: Never   Smokeless tobacco: Never  Vaping Use   Vaping status: Never Used  Substance and Sexual Activity   Alcohol use: No   Drug use: Never   Sexual activity: Not Currently    Birth control/protection: Implant  Other Topics Concern   Not on file  Social History Narrative   Not on file   Social Drivers of Health   Tobacco Use: Low Risk (11/01/2024)   Patient History    Smoking Tobacco Use: Never    Smokeless Tobacco Use: Never    Passive Exposure: Not on file  Financial Resource Strain: Low Risk (03/23/2024)   Overall Financial Resource Strain (CARDIA)    Difficulty of Paying Living Expenses: Not very hard  Food Insecurity: Food Insecurity Present (03/23/2024)   Hunger Vital Sign    Worried About Running Out of Food in the Last Year: Sometimes true    Ran Out of Food in the Last Year: Sometimes true  Transportation Needs: No Transportation Needs  (03/23/2024)   PRAPARE - Administrator, Civil Service (Medical): No    Lack of Transportation (Non-Medical): No  Physical Activity: Inactive (03/23/2024)   Exercise Vital Sign    Days of Exercise per Week: 0 days    Minutes of Exercise per Session: 0 min  Stress: No Stress Concern Present (03/23/2024)   [employer] of Occupational Health - Occupational Stress Questionnaire    Feeling of Stress : Only a little  Social Connections: Moderately Isolated (03/23/2024)   Social Connection and Isolation Panel    Frequency of Communication with Friends and Family: More than three times a week    Frequency of Social Gatherings with Friends and Family: More than three times a week    Attends Religious Services: More than 4 times per year    Active Member of [employer] or Organizations: No    Attends Banker Meetings: Never    Marital Status: Never married  Intimate Partner Violence: Not At Risk (03/23/2024)   Humiliation, Afraid, Rape, and Kick questionnaire    Fear of Current or Ex-Partner: No    Emotionally Abused: No    Physically Abused: No    Sexually Abused: No  Depression (PHQ2-9): Low Risk (11/01/2024)   Depression (PHQ2-9)    PHQ-2 Score: 0  Alcohol Screen: Low Risk (03/23/2024)   Alcohol  Screen    Last Alcohol Screening Score (AUDIT): 0  Housing: Low Risk (04/05/2024)   Housing Stability Vital Sign    Unable to Pay for Housing in the Last Year: No    Number of Times Moved in the Last Year: 1    Homeless in the Last Year: No  Recent Concern: Housing - High Risk (03/23/2024)   Housing Stability Vital Sign    Unable to Pay for Housing in the Last Year: Yes    Number of Times Moved in the Last Year: 1    Homeless in the Last Year: No  Utilities: Not At Risk (03/23/2024)   AHC Utilities    Threatened with loss of utilities: No  Health Literacy: Inadequate Health Literacy (03/23/2024)   B1300 Health Literacy    Frequency of need for help with medical instructions: Always     History reviewed. No pertinent family history.  History reviewed. No pertinent surgical history.  ROS: Review of Systems Negative except as stated above  PHYSICAL EXAM: BP 137/83 (BP Location: Left Arm)   Pulse 87   Temp 98.2 F (36.8 C)   Resp 16   Ht 5' 1 (1.549 m)   Wt 173 lb 6.4 oz (78.7 kg)   SpO2 96%   BMI 32.76 kg/m   Physical Exam  General: well-appearing, no acute distress Skin: no jaundice, rashes, or lesions Head: normocephalic, no lesions, no abnormal hair distribution or loss Eyes: anicteric sclera, pupils equally round and reactive to light and accommodation, extraocular movements intact Ears: no external lesions, tympanic membrane translucent  Nose: no septal deviation, turbinates clear Throat: trachea midline, no thyromegaly, moist mucus membranes Cardiovascular: regular heart rate and rhythm, normal S1/S2, no murmurs, gallops, or rubs, peripheral pulses 2+ bilaterally Chest: no skeletal deformity, lungs clear to auscultation bilaterally, equal breath sounds bilaterally Abdomen: soft, non-distended, non-tender to palpation, no hepatomegaly, no splenomegaly, normoactive bowel sounds GU: Normal appearing vulva, no lesions, inflammation, or discharge observed. Vagina appears normal with no lesions or discharge. Cervix is visualized, appearing pink and smooth, with no visible lesions. Pap smear collected.  Musculoskeletal: strength of upper and lower extremities equal bilaterally, 5/5, normal gait. Normal right arm ROM. Pain to palpation of supraspinatus musculature Extremities: no peripheral edema, nails intact Neurologic: cranial nerves II-XII intact   ASSESSMENT AND PLAN:  1. Encounter for annual wellness visit (Primary) - Complete physical exam performed today, findings listed above.  2. Encounter for Papanicolaou smear for cervical cancer screening - Cytology - PAP([place]) - Cervicovaginal ancillary only  3. Acute pain of right shoulder -  Most likely musculoskeletal pain, recommended use of over the counter NSAIDs for pain relief.  4. Peri-menopause - Stable, peri-menopause symptoms have resolved - Continue estradiol  (ESTRACE ) 1 MG tablet; Take 1 tablet (1 mg total) by mouth daily.  Dispense: 90 tablet; Refill: 2    Patient was given the opportunity to ask questions.  Patient verbalized understanding of the plan and was able to repeat key elements of the plan.   No orders of the defined types were placed in this encounter.    Requested Prescriptions   Signed Prescriptions Disp Refills   estradiol  (ESTRACE ) 1 MG tablet 90 tablet 2    Sig: Take 1 tablet (1 mg total) by mouth daily.    Return in about 3 months (around 01/30/2025) for follow-up a1c.  Sula Leavy Rode, PA-C      [1]  Current Outpatient Medications on File Prior to Visit  Medication Sig  Dispense Refill   metFORMIN  (GLUCOPHAGE ) 500 MG tablet Take 1 tablet (500 mg total) by mouth 2 (two) times daily with a meal. 180 tablet 3   No current facility-administered medications on file prior to visit.  [2] No Known Allergies

## 2024-11-02 LAB — CERVICOVAGINAL ANCILLARY ONLY
Bacterial Vaginitis (gardnerella): POSITIVE — AB
Candida Glabrata: NEGATIVE
Candida Vaginitis: POSITIVE — AB
Chlamydia: NEGATIVE
Comment: NEGATIVE
Comment: NEGATIVE
Comment: NEGATIVE
Comment: NEGATIVE
Comment: NEGATIVE
Comment: NORMAL
Neisseria Gonorrhea: NEGATIVE
Trichomonas: NEGATIVE

## 2024-11-05 ENCOUNTER — Ambulatory Visit: Payer: Self-pay

## 2024-11-05 DIAGNOSIS — B3731 Acute candidiasis of vulva and vagina: Secondary | ICD-10-CM

## 2024-11-05 DIAGNOSIS — B9689 Other specified bacterial agents as the cause of diseases classified elsewhere: Secondary | ICD-10-CM

## 2024-11-05 LAB — CYTOLOGY - PAP
Adequacy: ABSENT
Diagnosis: NEGATIVE

## 2024-11-05 MED ORDER — FLUCONAZOLE 150 MG PO TABS: 150.0000 mg | ORAL_TABLET | Freq: Once | ORAL | 0 refills | Status: AC

## 2024-11-05 MED ORDER — METRONIDAZOLE 500 MG PO TABS: 500.0000 mg | ORAL_TABLET | Freq: Two times a day (BID) | ORAL | 0 refills | Status: AC

## 2024-11-05 NOTE — Progress Notes (Signed)
 I spoke with patient via phone regarding results. Patient does report vaginal itching and discomfort with occasional thick discharge, this was not disclosed during office visit. Will send medication to pharmacy to treat for BV and Candida vaginitis given symptoms and positive test results.

## 2024-12-25 ENCOUNTER — Ambulatory Visit: Payer: Self-pay | Admitting: Physician Assistant

## 2025-01-30 ENCOUNTER — Ambulatory Visit: Payer: Self-pay
